# Patient Record
Sex: Female | Born: 1980 | Race: White | Hispanic: No | Marital: Married | Smoking: Former smoker
Health system: Southern US, Community
[De-identification: ages and names within clinical notes are randomized; demographics above are authoritative.]

## PROBLEM LIST (undated history)

## (undated) DIAGNOSIS — R519 Headache, unspecified: Secondary | ICD-10-CM

## (undated) DIAGNOSIS — K449 Diaphragmatic hernia without obstruction or gangrene: Secondary | ICD-10-CM

## (undated) DIAGNOSIS — I313 Pericardial effusion (noninflammatory): Secondary | ICD-10-CM

## (undated) DIAGNOSIS — M797 Fibromyalgia: Secondary | ICD-10-CM

## (undated) DIAGNOSIS — M351 Other overlap syndromes: Secondary | ICD-10-CM

## (undated) DIAGNOSIS — M1990 Unspecified osteoarthritis, unspecified site: Secondary | ICD-10-CM

## (undated) DIAGNOSIS — F32A Depression, unspecified: Secondary | ICD-10-CM

## (undated) DIAGNOSIS — R0602 Shortness of breath: Secondary | ICD-10-CM

## (undated) DIAGNOSIS — I472 Ventricular tachycardia, unspecified: Secondary | ICD-10-CM

## (undated) DIAGNOSIS — R5383 Other fatigue: Secondary | ICD-10-CM

## (undated) DIAGNOSIS — Q0700 Arnold-Chiari syndrome without spina bifida or hydrocephalus: Secondary | ICD-10-CM

## (undated) DIAGNOSIS — R002 Palpitations: Secondary | ICD-10-CM

## (undated) DIAGNOSIS — K219 Gastro-esophageal reflux disease without esophagitis: Secondary | ICD-10-CM

## (undated) DIAGNOSIS — J069 Acute upper respiratory infection, unspecified: Secondary | ICD-10-CM

## (undated) DIAGNOSIS — M329 Systemic lupus erythematosus, unspecified: Secondary | ICD-10-CM

## (undated) DIAGNOSIS — F329 Major depressive disorder, single episode, unspecified: Secondary | ICD-10-CM

## (undated) DIAGNOSIS — R51 Headache: Secondary | ICD-10-CM

## (undated) DIAGNOSIS — M0800 Unspecified juvenile rheumatoid arthritis of unspecified site: Secondary | ICD-10-CM

## (undated) DIAGNOSIS — F419 Anxiety disorder, unspecified: Secondary | ICD-10-CM

## (undated) DIAGNOSIS — I776 Arteritis, unspecified: Secondary | ICD-10-CM

## (undated) DIAGNOSIS — I3139 Other pericardial effusion (noninflammatory): Secondary | ICD-10-CM

## (undated) DIAGNOSIS — J45909 Unspecified asthma, uncomplicated: Secondary | ICD-10-CM

## (undated) DIAGNOSIS — M459 Ankylosing spondylitis of unspecified sites in spine: Secondary | ICD-10-CM

## (undated) HISTORY — DX: Major depressive disorder, single episode, unspecified: F32.9

## (undated) HISTORY — DX: Depression, unspecified: F32.A

## (undated) HISTORY — DX: Headache, unspecified: R51.9

## (undated) HISTORY — DX: Ventricular tachycardia: I47.2

## (undated) HISTORY — DX: Gastro-esophageal reflux disease without esophagitis: K21.9

## (undated) HISTORY — PX: NO PAST SURGERIES: SHX2092

## (undated) HISTORY — DX: Pericardial effusion (noninflammatory): I31.3

## (undated) HISTORY — DX: Unspecified asthma, uncomplicated: J45.909

## (undated) HISTORY — DX: Unspecified osteoarthritis, unspecified site: M19.90

## (undated) HISTORY — DX: Arnold-Chiari syndrome without spina bifida or hydrocephalus: Q07.00

## (undated) HISTORY — DX: Ventricular tachycardia, unspecified: I47.20

## (undated) HISTORY — DX: Other fatigue: R53.83

## (undated) HISTORY — DX: Shortness of breath: R06.02

## (undated) HISTORY — DX: Arteritis, unspecified: I77.6

## (undated) HISTORY — DX: Fibromyalgia: M79.7

## (undated) HISTORY — DX: Other overlap syndromes: M35.1

## (undated) HISTORY — DX: Unspecified juvenile rheumatoid arthritis of unspecified site: M08.00

## (undated) HISTORY — DX: Diaphragmatic hernia without obstruction or gangrene: K44.9

## (undated) HISTORY — DX: Anxiety disorder, unspecified: F41.9

## (undated) HISTORY — DX: Systemic lupus erythematosus, unspecified: M32.9

## (undated) HISTORY — DX: Palpitations: R00.2

## (undated) HISTORY — DX: Headache: R51

## (undated) HISTORY — DX: Acute upper respiratory infection, unspecified: J06.9

## (undated) HISTORY — DX: Other pericardial effusion (noninflammatory): I31.39

---

## 2002-12-25 ENCOUNTER — Encounter: Payer: Self-pay | Admitting: Cardiology

## 2008-11-29 ENCOUNTER — Emergency Department (HOSPITAL_COMMUNITY): Admission: EM | Admit: 2008-11-29 | Discharge: 2008-11-29 | Payer: Self-pay | Admitting: Emergency Medicine

## 2008-11-30 ENCOUNTER — Ambulatory Visit: Payer: Self-pay | Admitting: Cardiology

## 2008-12-06 ENCOUNTER — Ambulatory Visit: Payer: Self-pay | Admitting: Cardiology

## 2009-06-06 DIAGNOSIS — R002 Palpitations: Secondary | ICD-10-CM | POA: Insufficient documentation

## 2009-06-06 DIAGNOSIS — R0602 Shortness of breath: Secondary | ICD-10-CM

## 2009-06-22 IMAGING — CR DG CHEST 2V
2 series · 2 of 2 positions shown · non-contrast
Comparison: None

CLINICAL DATA: Chest pain, palpitations

CHEST - 2 VIEW

[w chest pa]
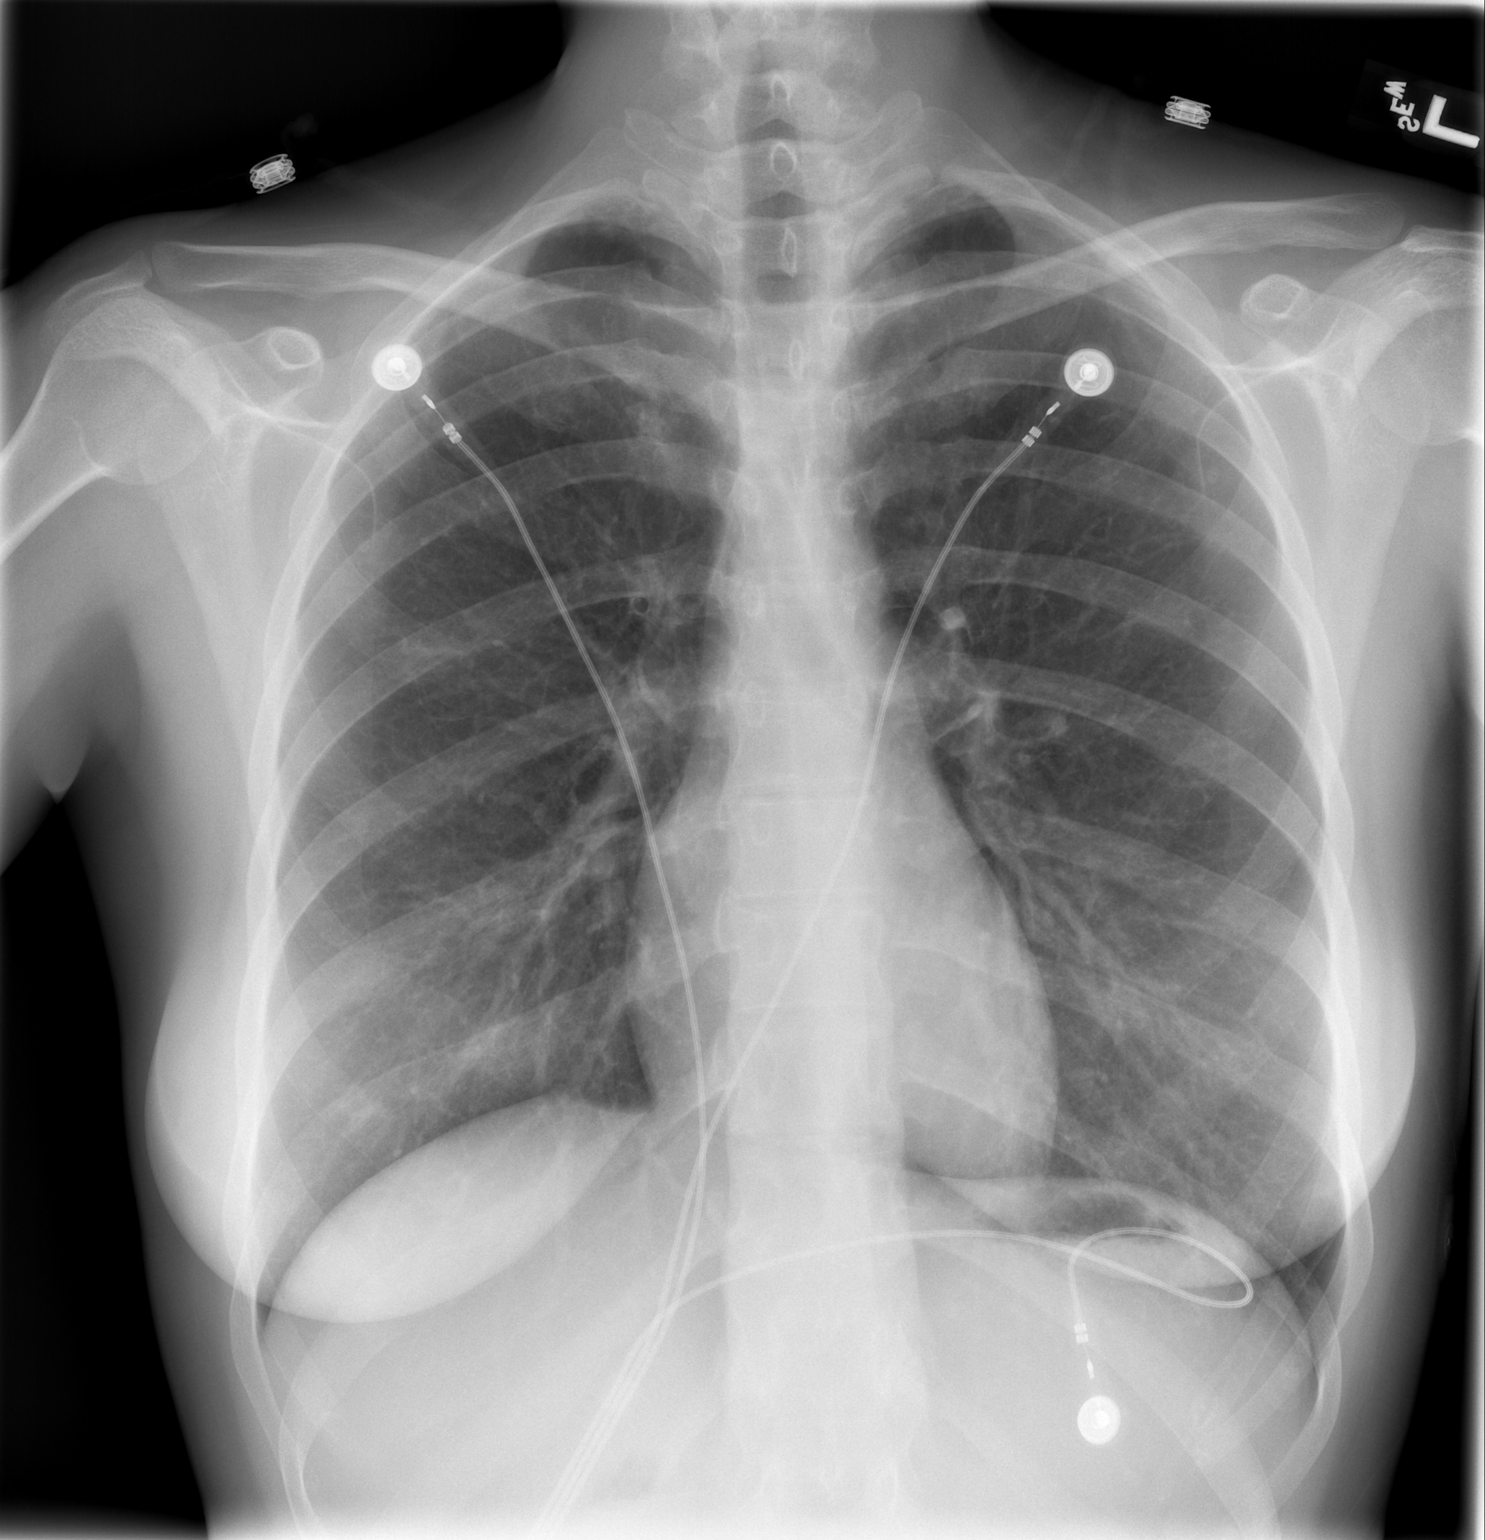

[w chest lat]
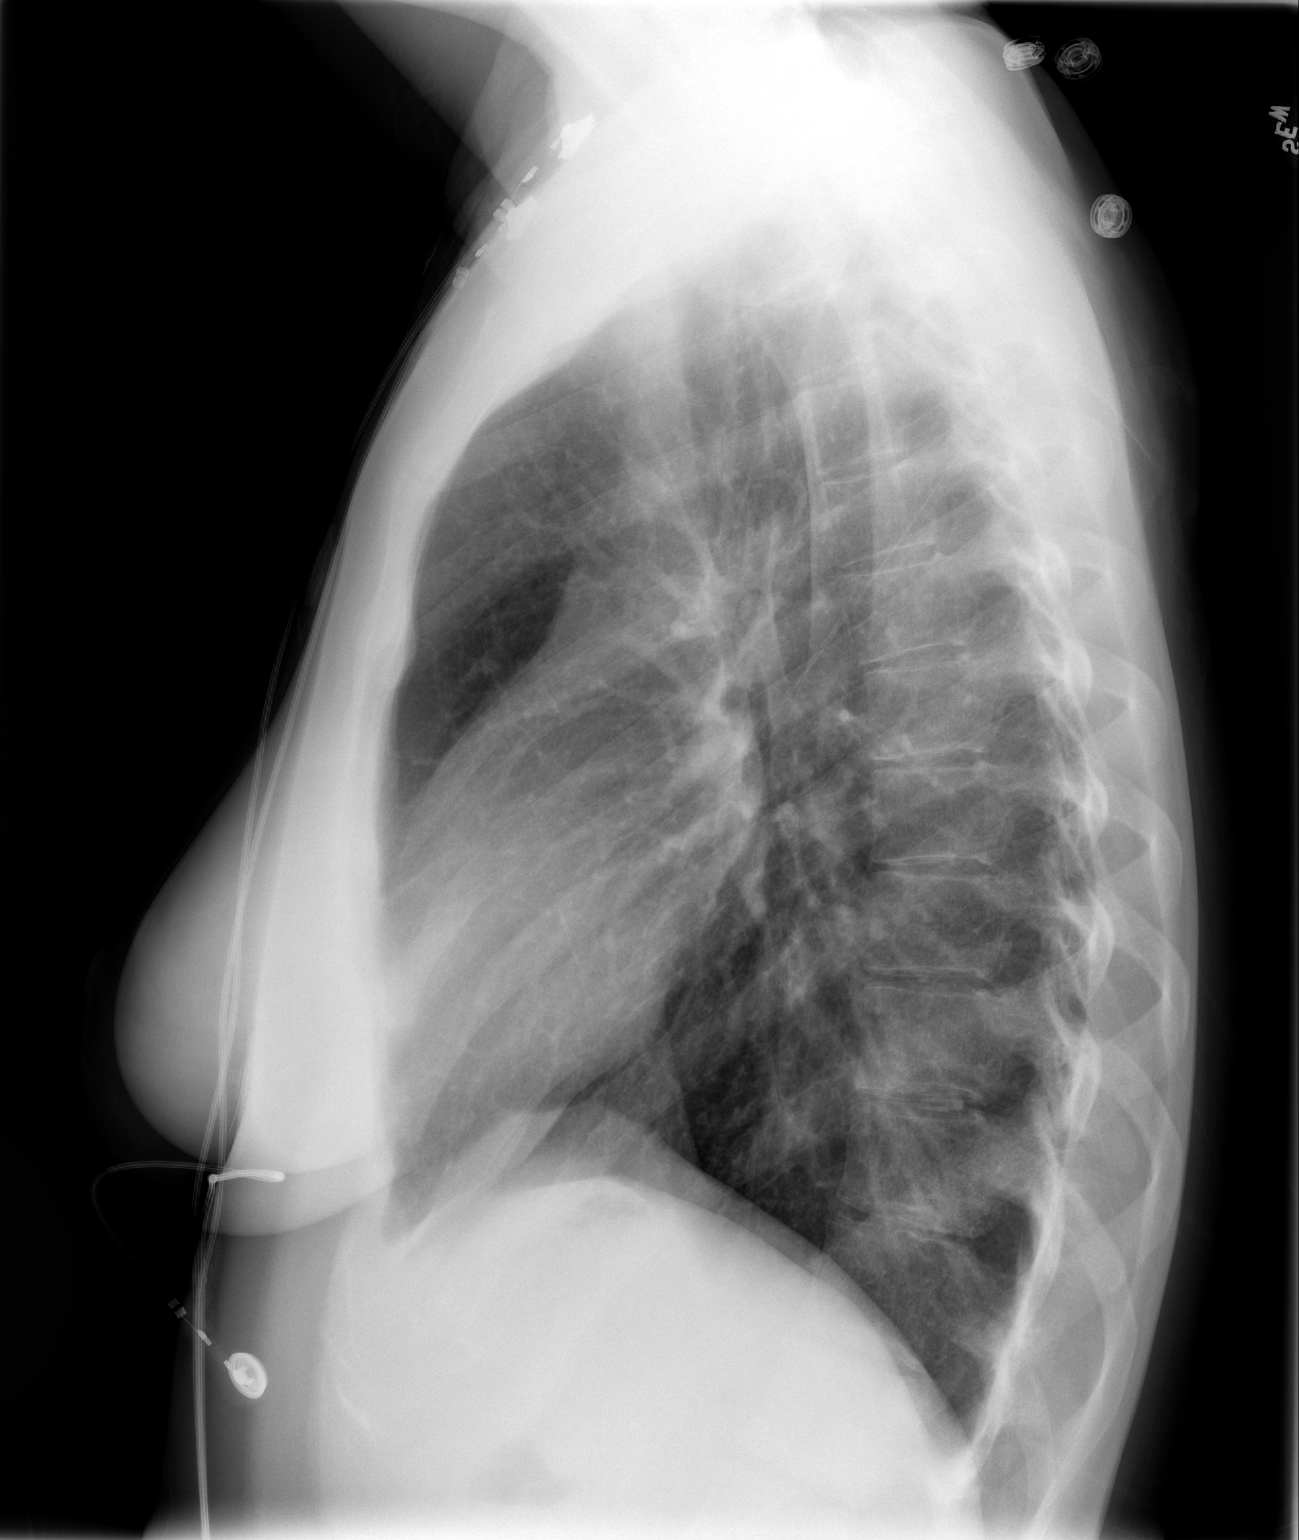

[2 of 2 positions shown; findings below may reference images not displayed]

FINDINGS: Normal heart size, mediastinal contours, and pulmonary vascularity.
Bronchitic changes and hyperaeration, question asthma.
No pulmonary infiltrate, pleural effusion, or pneumothorax.
Cardiac monitoring lines project over chest.
Bones unremarkable.
IMPRESSION: Peribronchial thickening with hyperinflated lungs, question asthma.
No acute infiltrate.

## 2009-07-23 ENCOUNTER — Telehealth (INDEPENDENT_AMBULATORY_CARE_PROVIDER_SITE_OTHER): Payer: Self-pay | Admitting: *Deleted

## 2010-09-10 ENCOUNTER — Encounter: Payer: Self-pay | Admitting: Cardiology

## 2010-10-09 NOTE — Letter (Signed)
Summary: Appointment- Rescheduled  [facility] at [facility] [address]   [place], [place] [address]   Phone: [phone]  Fax: [phone]     September 10, 2010 MRN: [id number]     [facility] [address] [place], [place]  [address]     Dear Ms. SCOTT,   Due to a change in our office schedule, your appointment on October 13, 2010 at  9:30 AM must be changed.    Your new appointment will be October 27, 2010 at 8:30 AM.   We look forward to participating in your health care needs.      Sincerely,  Glass blower/designer

## 2010-10-13 ENCOUNTER — Encounter: Payer: Self-pay | Admitting: Cardiology

## 2010-10-13 ENCOUNTER — Ambulatory Visit (INDEPENDENT_AMBULATORY_CARE_PROVIDER_SITE_OTHER): Payer: BC Managed Care – PPO | Admitting: Cardiology

## 2010-10-13 ENCOUNTER — Encounter (INDEPENDENT_AMBULATORY_CARE_PROVIDER_SITE_OTHER): Payer: Self-pay | Admitting: *Deleted

## 2010-10-13 DIAGNOSIS — IMO0001 Reserved for inherently not codable concepts without codable children: Secondary | ICD-10-CM | POA: Insufficient documentation

## 2010-10-13 DIAGNOSIS — R002 Palpitations: Secondary | ICD-10-CM

## 2010-10-13 DIAGNOSIS — M083 Juvenile rheumatoid polyarthritis (seronegative): Secondary | ICD-10-CM | POA: Insufficient documentation

## 2010-10-23 NOTE — Miscellaneous (Signed)
Summary: Orders Update - TSH  Clinical Lists Changes  Orders: Added new Test order of T-TSH ([id number]) - Signed 

## 2010-10-23 NOTE — Letter (Signed)
Summary: Graded Exercise Tolerance Test  [facility] at [facility] [address]   [place], [place] [address]   Phone: [phone]  Fax: [phone]      [facility] Cardiovascular Services  Graded Exercise Tolerance Test    [facility]  Appointment Date:_  Appointment Time:_   Your doctor has ordered a stress test to help determine the condition of your  heart during exercise. If you take blood pressure medicine , ask your doctor if you should take it the day of your test. You may eat a light meal before your test.  Please be sure to bring the copy of your order with you.   You should dress comfortably, for example: Sweat pants, shorts, or skirt, Loose                                                                                                       short sleeved T-shirt, Rubber soled lace-up shoes (tennis shoes)  You will need to arrive 15 minutes before your appointment time. You will also need to enter at the Main Entrance of the hospital and go to the registration desk. They will direct you to the Cardiovascular Department on the third floor.  You will need to plan on being at the hospital for one hour from registration for this appointment.

## 2010-10-23 NOTE — Assessment & Plan Note (Signed)
Summary: 1 YR FU MISSED LAST 2 APPTS-VS R/S DUE TO EPIC GO LIVE LETTER...   Visit Type:  Follow-up Primary Provider:  Sherryll Burger   History of Present Illness: the patient is a 30 year old female with a history of fibromyalgia and juvenile rheumatoid arthritis.  The patient has been seen in 2007 for a history of palpitations which was associated with Savella, medication which was taken at the time for fibromyalgia.  She has discontinued this in the meanwhile.  She also had a cardiac monitor done at that time which showed a single run of nonsustained ventricular tachycardia of 12 beats at a rate of hundred beats/min.  The rhythm strip was dated November 21, 2008.  No other rhythm abnormalities were observed.  The patient had a workup with an echocardiogram which was within normal limits.  After she discontinued the medication she did well and had very little in the way of palpitations.  We initially place her on Inderal but this appeared to cause weight gain and the patient was eventually switched to metoprolol.  The patient stated she had been doing well up until a few months ago.  She noted that she started having skipped beats and palpitations.  At times the last 30 seconds approximately 1 minute.  They do not occur every day.  There is no associated dizziness or weakness presyncope or syncope.  The patient does notice an increase in the symptoms with excess caffeine intake.  She denies exertional chest pains of motion muscle breath orthopnea or PND.  Her electrocardiogram was reviewed and showed normal sinus rhythm with no acute ischemic changes.  There is no evidence of prolonged QT or short QT syndrome suggestive of sodium channel abnormalities.  There is no evidence of Brugada syndrome.  The patient also denies any drugs or tobacco use.  She does report that the episodes seems to be more frequent during exertion rather then rest.  Preventive Screening-Counseling & Management  Alcohol-Tobacco     Smoking  Status: quit     Year Quit: 2008  Current Medications (verified): 1)  Metoprolol Tartrate 25 Mg Tabs (Metoprolol Tartrate) .... Take 1 1/2 Tabs (37.5mg ) Two Times A Day 2)  Multivitamins  Tabs (Multiple Vitamin) .... Take 1 Tablet By Mouth Once A Day 3)  Ibuprofen 800 Mg Tabs (Ibuprofen) .... Take 1 Tablet By Mouth Two Times A Day 4)  Amitriptyline Hcl 100 Mg Tabs (Amitriptyline Hcl) .... Take 1 Tablet By Mouth Once A Day [facility] 5)  Fish Oil 1000 Mg Caps (Omega-3 Fatty Acids) .... Take 1 Tablet By Mouth Once A Day 6)  Zarah 3-0.03 Mg Tabs (Drospirenone-Ethinyl Estradiol) .... Take 1 Tablet By Mouth Once A Day 7)  Hydrocodone-Acetaminophen 5-325 Mg Tabs (Hydrocodone-Acetaminophen) .... Take 1/2-1 Tablet By Mouth Once A Day As Needed  Allergies (verified): 1)  ! Cephalosporins  Comments:  Nurse/Medical Assistant: The patient's medication bottles and allergies were reviewed with the patient and were updated in the Medication and Allergy Lists.  Past History:  Family History: Last updated: 06/06/2009 Family History of CVA or Stroke:  Family History of Hyperlipidemia:  Family History of Hypertension:   Past Medical History: DYSPNEA (ICD-786.05) PALPITATIONS (ICD-785.1) Fibromyalgia.  Small pericardial effusion.  history of nonsustained ventricular tachycardia by cardiac monitor November 21, 2008 no other arrhythmias and negative workup for structural heart disease [normal echocardiogram]  Review of Systems       The patient complains of fatigue and palpitations.  The patient denies malaise, fever, weight gain/loss,  vision loss, decreased hearing, hoarseness, chest pain, shortness of breath, prolonged cough, wheezing, sleep apnea, coughing up blood, abdominal pain, blood in stool, nausea, vomiting, diarrhea, heartburn, incontinence, blood in urine, muscle weakness, joint pain, leg swelling, rash, skin lesions, headache, fainting, dizziness, depression, anxiety, enlarged lymph nodes, easy  bruising or bleeding, and environmental allergies.    Vital Signs:  Patient profile:   30 year old female Height:      70 inches Weight:      172 pounds BMI:     24.77 Pulse rate:   69 / minute BP sitting:   116 / 67  (left arm) Cuff size:   regular  Vitals Entered By: Carlye Grippe (October 13, 2010 9:41 AM)  Physical Exam  Additional Exam:  General: Well-developed, well-nourished in no distress head: Normocephalic and atraumatic eyes PERRLA/EOMI intact, conjunctiva and lids normal nose: No deformity or lesions mouth normal dentition, normal posterior pharynx neck: Supple, no JVD.  No masses, thyromegaly or abnormal cervical nodes lungs: Normal breath sounds bilaterally without wheezing.  Normal percussion heart: regular rate and rhythm with normal S1 and S2, no S3 or S4.  PMI is normal.  No pathological murmurs abdomen: Normal bowel sounds, abdomen is soft and nontender without masses, organomegaly or hernias noted.  No hepatosplenomegaly musculoskeletal: Back normal, normal gait muscle strength and tone normal pulsus: Pulse is normal in all 4 extremities Extremities: No peripheral pitting edema neurologic: Alert and oriented x 3 skin: Intact without lesions or rashes, multiple tattoos are noticed on the back cervical nodes: No significant adenopathy psychologic: Normal affect    EKG  Procedure date:  10/13/2010  Findings:      normal sinus rhythm no acute ischemic changes  Impression & Recommendations:  Problem # 1:  PALPITATIONS (ICD-785.1) the patient reports an increase in palpitations during activity.  On baseline EKG there appears to be no evidence of a channelopathy. However we will make sure that the patient does not have exercise-induced ventricular tachycardia.  She will be scheduled for an exercise treadmill test.  I asked her to hold her metoprolol the morning of the stress test.  In the interim however I did increase her metoprolol to 37.5 milligrams p.o.  b.i.d.because the patient does report increasing palpitations if he does skip a dose of her medication.  Will also schedule a TSH. Her updated medication list for this problem includes:    Metoprolol Tartrate 25 Mg Tabs (Metoprolol tartrate) .Marland Kitchen... Take 1 1/2 tabs (37.5mg ) two times a day  Orders: EKG w/ Interpretation (93000) GXT (GXT)  Problem # 2:  DYSPNEA (ICD-786.05) no evidence of recurrent dyspnea.  No prior history of structural heart disease. Her updated medication list for this problem includes:    Metoprolol Tartrate 25 Mg Tabs (Metoprolol tartrate) .Marland Kitchen... Take 1 1/2 tabs (37.5mg ) two times a day  Problem # 3:  FIBROMYALGIA (ICD-729.1) Assessment: Comment Only currently not on medications without any significant symptoms.  Patient Instructions: 1)  GXT - exercise stress test - hold Metoprolol a.m. of test 2)  Increase Metoprolol to 37.5mg  two times a day  3)  Follow up in  6 months Prescriptions: METOPROLOL TARTRATE 25 MG TABS (METOPROLOL TARTRATE) take 1 1/2 tabs (37.5mg ) two times a day  #90 x 6   Entered by:   Hoover Brunette, LPN   Authorized by:   Lewayne Bunting, MD, [facility]   Signed by:   Hoover Brunette, LPN on 04/54/0981   Method used:   Electronically to  [facility] (retail)       [address]       [place], [place]  [address]       Ph: [phone]       Fax: [phone]   RxID:   [id number]

## 2010-10-27 ENCOUNTER — Ambulatory Visit: Payer: Self-pay | Admitting: Cardiology

## 2010-11-05 ENCOUNTER — Encounter: Payer: Self-pay | Admitting: Cardiology

## 2010-11-05 DIAGNOSIS — R002 Palpitations: Secondary | ICD-10-CM

## 2010-11-11 ENCOUNTER — Encounter (INDEPENDENT_AMBULATORY_CARE_PROVIDER_SITE_OTHER): Payer: Self-pay | Admitting: *Deleted

## 2010-11-18 NOTE — Letter (Signed)
Summary: Engineer, materials at [facility]  [address]   [place], [place] [address]   Phone: [phone]  Fax: [phone]        November 11, 2010 MRN: [id number]   [facility] [address] [place], [place]  [address]   Dear Ms. Anita George,  Your test ordered by Selena Batten has been reviewed by your physician (or physician assistant) and was found to be normal or stable. Your physician (or physician assistant) felt no changes were needed at this time.  ____ Echocardiogram  __X__ Cardiac Stress Test  __X__ Lab Work - thyroid studies normal  ____ Peripheral vascular study of arms, legs or neck  ____ CT scan or X-ray  ____ Lung or Breathing test  ____ Other:   Thank you.   Hoover Brunette, LPN    Duane Boston, M.D., F.A.C.C. Thressa Sheller, M.D., F.A.C.C. Oneal Grout, M.D., F.A.C.C. Cheree Ditto, M.D., F.A.C.C. Daiva Nakayama, M.D., F.A.C.C. Kenney Houseman, M.D., F.A.C.C. Jeanne Ivan, PA-C

## 2010-12-18 LAB — POCT CARDIAC MARKERS
CKMB, poc: <1 ng/mL — ABNORMAL LOW (ref 1.0–8.0)
Troponin i, poc: <0.05 ng/mL (ref 0.00–0.09)

## 2010-12-18 LAB — POCT I-STAT, CHEM 8
BUN: 8 mg/dL (ref 6–23)
Chloride: 108 mEq/L (ref 96–112)
Creatinine, Ser: 0.8 mg/dL (ref 0.4–1.2)
Glucose, Bld: 79 mg/dL (ref 70–99)
Potassium: 3.5 mEq/L (ref 3.5–5.1)

## 2010-12-18 LAB — DIFFERENTIAL
Eosinophils Absolute: 0.1 10*3/uL (ref 0.0–0.7)
Lymphs Abs: 2.7 10*3/uL (ref 0.7–4.0)
Monocytes Relative: 7 % (ref 3–12)
Neutro Abs: 5.4 10*3/uL (ref 1.7–7.7)
Neutrophils Relative %: 60 % (ref 43–77)

## 2010-12-18 LAB — CBC
MCV: 86.6 fL (ref 78.0–100.0)
RBC: 4.35 MIL/uL (ref 3.87–5.11)
WBC: 9 10*3/uL (ref 4.0–10.5)

## 2011-01-20 NOTE — Assessment & Plan Note (Signed)
[facility]                          [facility] OFFICE NOTE   Anita George, Anita George                        MRN:          [id number]  DATE:11/30/2008                            DOB:          September 27, 1980    REFERRING PHYSICIAN:  Kirstie Peri, MD   The patient is actually self-referred by her grandmother.   HISTORY OF PRESENT ILLNESS:  The patient is a 30 year old female with a  history of fibromyalgia and juvenile rheumatoid arthritis.  The patient  over the last couple of weeks has complained of significant  palpitations.  This has been associated to the left-sided chest pain.  She also reports during one of her episodes of palpitations, syncope.  She states this was very brief and she does not recall the exact  circumstances.  She works as a Pensions consultant at [employer].  She  states that the palpitations occur on a daily basis and are associated  with some shortness of breath.  She states that particularly when she  starts taking Savella for fibromyalgias that these symptoms have started  to occur.  Dr. Sherryll Burger has placed a CardioNet monitor and there was 1  episode of ventricular tachycardia noted at 12 beats, but this was slow  VT at 100 beats per minute.  There is no clear history that there was  any syncope associated with this.  The rhythm strip was dated on November 21, 2008.  The patient denies any exertional chest pain, orthopnea, PND,  or other cardiovascular symptoms, apart from the symptoms as outlined  above.  She had an echocardiographic study done, which showed  essentially normal heart size and function, but there was a small  pericardial effusion.  EKG demonstrates normal sinus rhythm with no  acute ischemic changes and no evidence of acute pericarditis.  The  patient does state that when she is not taking Savella, her  fibromyalgias are rather severe particularly in her neck area and back,  as well as in the joints and prior to this,  she used to take p.r.n.  ibuprofen.   ALLERGIES:  Cephalosporins cause a rash.   MEDICATIONS:  1. Savella 50 mg p.o. b.i.d.  2. Claritin 10 mg p.o. daily.  3. Ocella BC daily.  4. Clonazepam 0.5 mg p.r.n.   SOCIAL HISTORY:  The patient works at [employer].  She is  married.  She is to be in accounting.  The patient occasionally drinks  beer.  She occasionally use marijuana, but quit in 1999.  She quit also  smoking in 2008.   FAMILY HISTORY:  Both Father and mother are alive as well as brother and  sister, and there is a family history of stroke, high cholesterol, and  high blood pressure.   REVIEW OF SYSTEMS:  The patient does complain of sharp knife-like pain  and tightness.  She rate this as a 7/10.  She states it is a random  pattern and it is usually at rest.  The duration is 10-13 minutes and is  associated with nausea and dizziness and shortness of breath.  REMAINDER REVIEW OF SYSTEMS:  The patient occasionally reports headaches  and fatigue.  NEUROLOGIC:  She complains of weakness.  HEMATOLOGIC AND  ENDOCRINE:  There is no problem.  CARDIOVASCULAR:  As outlined above.  GASTROINTESTINAL:  She reports no symptoms.  GU:  She reports no  symptoms.  MUSCULOSKELETAL:  She reports muscle disease.   PHYSICAL EXAMINATION:  VITAL SIGNS:  Blood pressure is 127/76, heart  rate 98, and weight 165 pounds.  GENERAL:  A well-nourished white female in no apparent distress.  HEENT:  Pupils isocoric.  Conjunctiva clear.  NECK:  Supple.  Normal carotid stroke and no carotid bruits.  No  thyromegaly.  No nodular thyroid.  LUNGS:  Clear breath sounds bilaterally.  HEART:  Regular rate and rhythm with normal S1, S2, and no S3.  There is  no pathological murmurs.  ABDOMEN:  Soft, nontender.  No rebound or guarding.  Good bowel sounds.  EXTREMITIES:  No cyanosis, clubbing, or edema.  NEURO:  The patient is alert, oriented, and grossly nonfocal.   PROBLEM LIST:  1. Palpitations and  chest pain.  2. Dyspnea.  3. Fibromyalgia.  4. Small pericardial effusion.   PLAN:  1. The etiology of the patient's palpitations are not entirely clear,      but there is a CardioNet monitor that demonstrates an episode of 12      beats of nonsustained ventricular tachycardia.  The patient,      however, by echo has no significant structural heart disease.  The      latter could be causing her symptoms and could be related to the      [facility] that she is using.  2. I would recommend for the patient to discontinue Savella and we      will start on propranolol LA 60 mg p.o. daily, partly also because      this can help her with a component of anxiety.  3. I told the patient that I am not sure why she has a small      pericardial effusion, but we will further examine her with      laboratory studies in particular to rule out the collagen vascular      diseases and I have ordered a sed rate, a high-sensitivity CRP and      ANA and rheumatoid factor, particularly in light of her history of      juvenile rheumatoid arthritis.  Her diagnosis of fibromyalgia seems      to be well established, although if there is any abnormalities in      her sed rate and high-sensitivity CRP.  A further workup may be      required.  4. I do not think the patient needs any stress testing; but at this      point, we will stop the [facility] and assess the response to      propranolol LA.     Learta Codding, MD,FACC  Electronically Signed    GED/MedQ  DD: 12/02/2008  DT: 12/03/2008  Job #: [id number]   cc:   Kirstie Peri, MD

## 2011-03-05 ENCOUNTER — Encounter: Payer: Self-pay | Admitting: Cardiology

## 2011-05-28 ENCOUNTER — Other Ambulatory Visit: Payer: Self-pay | Admitting: Cardiology

## 2011-07-27 ENCOUNTER — Other Ambulatory Visit: Payer: Self-pay | Admitting: Cardiology

## 2011-09-25 ENCOUNTER — Other Ambulatory Visit: Payer: Self-pay | Admitting: Cardiology

## 2012-09-07 HISTORY — PX: UPPER GASTROINTESTINAL ENDOSCOPY: SHX188

## 2012-10-05 ENCOUNTER — Encounter (INDEPENDENT_AMBULATORY_CARE_PROVIDER_SITE_OTHER): Payer: Self-pay | Admitting: Internal Medicine

## 2012-10-05 ENCOUNTER — Ambulatory Visit (INDEPENDENT_AMBULATORY_CARE_PROVIDER_SITE_OTHER): Payer: BC Managed Care – PPO | Admitting: Internal Medicine

## 2012-10-05 VITALS — BP 108/50 | HR 60 | Temp 98.3°F | Ht 71.0 in | Wt 156.2 lb

## 2012-10-05 DIAGNOSIS — R634 Abnormal weight loss: Secondary | ICD-10-CM

## 2012-10-05 DIAGNOSIS — R1012 Left upper quadrant pain: Secondary | ICD-10-CM

## 2012-10-05 DIAGNOSIS — K625 Hemorrhage of anus and rectum: Secondary | ICD-10-CM

## 2012-10-05 DIAGNOSIS — M954 Acquired deformity of chest and rib: Secondary | ICD-10-CM | POA: Insufficient documentation

## 2012-10-05 LAB — CBC WITH DIFFERENTIAL/PLATELET
Basophils Relative: 1 % (ref 0–1)
Hemoglobin: 13.3 g/dL (ref 12.0–15.0)
Lymphs Abs: 2.3 10*3/uL (ref 0.7–4.0)
Monocytes Relative: 10 % (ref 3–12)
Neutro Abs: 3.6 10*3/uL (ref 1.7–7.7)
Neutrophils Relative %: 53 % (ref 43–77)
RBC: 4.67 MIL/uL (ref 3.87–5.11)

## 2012-10-05 NOTE — Progress Notes (Addendum)
Subjective:     Patient ID: Anita George, female   DOB: [birth year], 32 y.o.   MRN: [id number]  HPI  Referred to our office by Dr. Sherryll Burger for epigastric pain. She says she has a swelling to her abdomen which comes and goes. She says it feels like her ribs are spread. Everytime she has a BM she has pain left upper abdomen. Appetite is not good. She says she snack all day. She does not feel she can eat meal. She tells me everytime she eats, she will become nauseated. Symptoms started in July. Sudden onset. She has a BM about three times a week. Stools are usually green in color or light brown. She tells me she has had some bright red rectal bleeding. Last episode was one week ago.  She says the blood dripped in the toilet.  Really no acid reflux LMP 2 weeks ago: normal.  She is a Pharmacologist. Hx of fibromyalgia. No hx of injury   She has lost 35 pounds since July. She says she cannot eat. She has epigastric pain when she eats.  04/04/2012  US abdomen :nausea: Negative exam. Gallbladder unremarkable with no evidence of any gallstone or wall thickening or pericholecystic fluid. CBD top normal in diameter at 5mm. 04/11/2012 Urinalaysis negative., NA 139, K 3.7, Glucose 116, Creatinine 0.63, Protein 6.4, Calcium 9.2, ALP 38, AST 13, ALT 12, H and H 13.0 and 37.4, Platelet ct 118, MPV 11.1, H. Pylori negative   09/21/2012 Chest xray: No active disease. Bone thorax is unremarkable.   Review of Systems see hpi Current Outpatient Prescriptions  Medication Sig Dispense Refill  . baclofen (LIORESAL) 10 MG tablet Take 10 mg by mouth at bedtime.      . buprenorphine (BUTRANS) 10 MCG/HR PTWK Place 10 mcg onto the skin once a week.      . drospirenone-ethinyl estradiol (ZARAH) 3-0.03 MG per tablet Take 1 tablet by mouth daily.        Marland Kitchen HYDROcodone-acetaminophen (NORCO) 5-325 MG per tablet Take 1 tablet by mouth daily as needed. Take 1/2-1 tab       . metoprolol tartrate (LOPRESSOR) 25 MG tablet  TAKE ONE & ONE-HALF TABLETS BY MOUTH TWICE DAILY  90 tablet  0  . Multiple Vitamins-Minerals (MULTIVITAL) tablet Take 1 tablet by mouth daily.        . Omega-3 Fatty Acids (FISH OIL) 1000 MG CAPS Take by mouth daily.        Marland Kitchen omeprazole (PRILOSEC) 20 MG capsule Take 20 mg by mouth 2 (two) times daily before a meal.      . polyethylene glycol (MIRALAX / GLYCOLAX) packet Take 17 g by mouth daily.       Past Medical History  Diagnosis Date  . Shortness of breath   . Palpitations   . Fibromyalgia   . Pericardial effusion   . Ventricular tachycardia     nonsutained by cardiac monitor November 21, 2008 no other arrhythmias and negative workup for structural heart diseas e(normal echo)   History reviewed. No pertinent past surgical history. Allergies  Allergen Reactions  . Cephalosporins     REACTION: rash       Objective:   Physical Exam  Filed Vitals:   10/05/12 1423  BP: 108/50  Pulse: 60  Temp: 98.3 F (36.8 C)  Height: 5\' 11"  (1.803 m)  Weight: 156 lb 3.2 oz (70.852 kg)  Alert and oriented. Skin warm and dry. Oral mucosa is moist.   .  Sclera anicteric, conjunctivae is pink. Thyroid not enlarged. No cervical lymphadenopathy. Lungs clear. Heart regular rate and rhythm.  Abdomen is soft. Noted  Bowel sounds are positive. No hepatomegaly. No abdominal masses felt. No tenderness.  No edema to lower extremities.         Assessment:    Possible IBS. Patient has had some blood with her BMs with out Miralax. Dr. Karilyn Cota in with patient.   Deformity of left ribs, possibly related to scoliosis.  Plan:    CT abdomen/pelvis with CM. If CT shows bowel thickening ? Colonoscopy.

## 2012-10-05 NOTE — Patient Instructions (Addendum)
Labs and CT. Further recommendations to follow 

## 2012-10-06 ENCOUNTER — Telehealth (INDEPENDENT_AMBULATORY_CARE_PROVIDER_SITE_OTHER): Payer: Self-pay | Admitting: Internal Medicine

## 2012-10-06 LAB — COMPREHENSIVE METABOLIC PANEL
ALT: 14 U/L (ref 0–35)
Albumin: 4.5 g/dL (ref 3.5–5.2)
Alkaline Phosphatase: 46 U/L (ref 39–117)
CO2: 21 mEq/L (ref 19–32)
Glucose, Bld: 75 mg/dL (ref 70–99)
Potassium: 4 mEq/L (ref 3.5–5.3)
Sodium: 139 mEq/L (ref 135–145)
Total Protein: 7.5 g/dL (ref 6.0–8.3)

## 2012-10-06 NOTE — Telephone Encounter (Signed)
none

## 2012-10-10 ENCOUNTER — Ambulatory Visit (INDEPENDENT_AMBULATORY_CARE_PROVIDER_SITE_OTHER): Payer: BC Managed Care – PPO | Admitting: Internal Medicine

## 2012-10-10 ENCOUNTER — Ambulatory Visit (HOSPITAL_COMMUNITY)
Admission: RE | Admit: 2012-10-10 | Discharge: 2012-10-10 | Disposition: A | Discharge status: Home / Self Care | Payer: BC Managed Care – PPO | Source: Ambulatory Visit | Attending: Internal Medicine | Admitting: Internal Medicine

## 2012-10-10 DIAGNOSIS — R110 Nausea: Secondary | ICD-10-CM | POA: Insufficient documentation

## 2012-10-10 DIAGNOSIS — R1012 Left upper quadrant pain: Secondary | ICD-10-CM | POA: Insufficient documentation

## 2012-10-10 DIAGNOSIS — R634 Abnormal weight loss: Secondary | ICD-10-CM | POA: Insufficient documentation

## 2012-10-10 IMAGING — CT CT ABD-PELV W/ CM
2 of 4 series · 16 of 46 positions shown, 18 images · IV contrast (Omnipaque 300)
Comparison: None

CLINICAL DATA: Left upper abdominal pain. Nausea and constipation.
Pain with bowel movements.  Weight loss..

CT ABDOMEN AND PELVIS WITH CONTRAST
TECHNIQUE: Multidetector CT imaging of the abdomen and pelvis was
performed following the standard protocol during bolus
administration of intravenous contrast.
Contrast: 100mL OMNIPAQUE IOHEXOL 300 MG/ML  SOLN

[Series 2: abd_pel_with 5.0 b40f · axial · 0.62mm/px · z∈[-489,-49]mm · 13 of 96 slices shown, 15 images]
[im 4/96  soft-tissue]
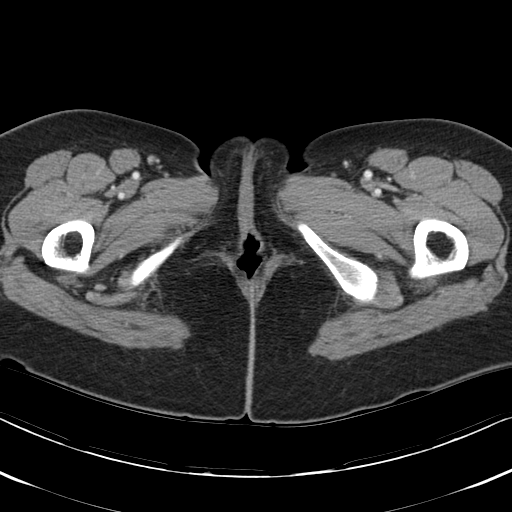
[im 4/96  bone]
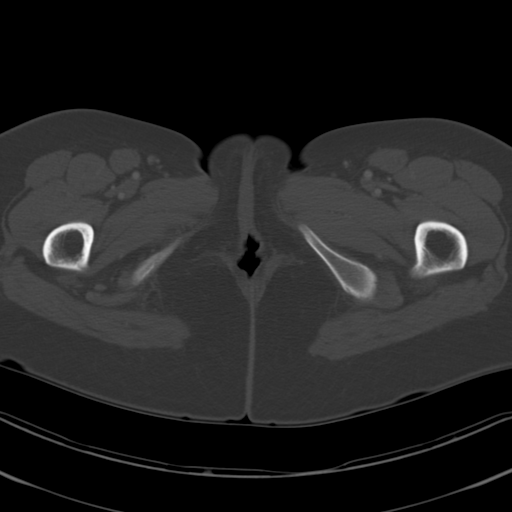
[im 12/96  soft-tissue]
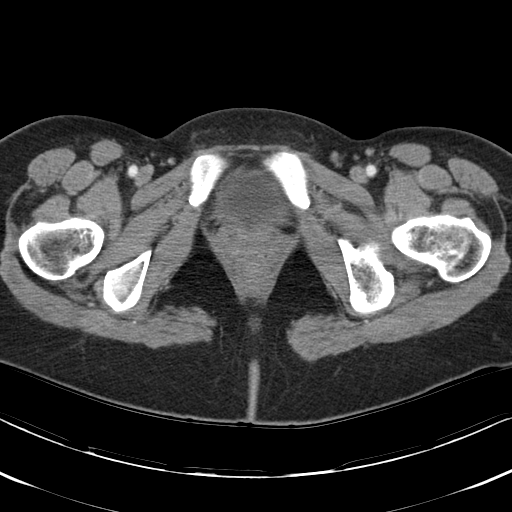
[im 20/96  soft-tissue]
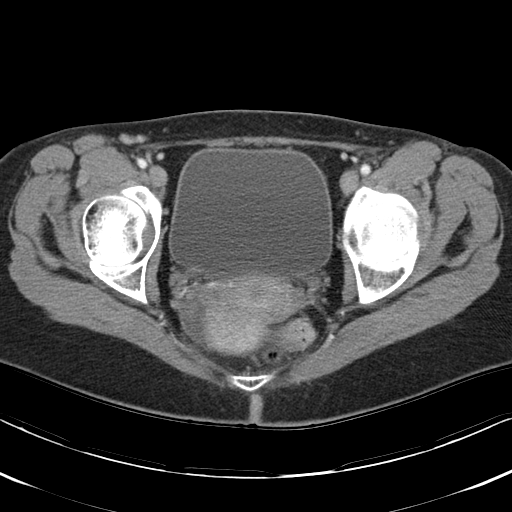
[im 27/96  soft-tissue]
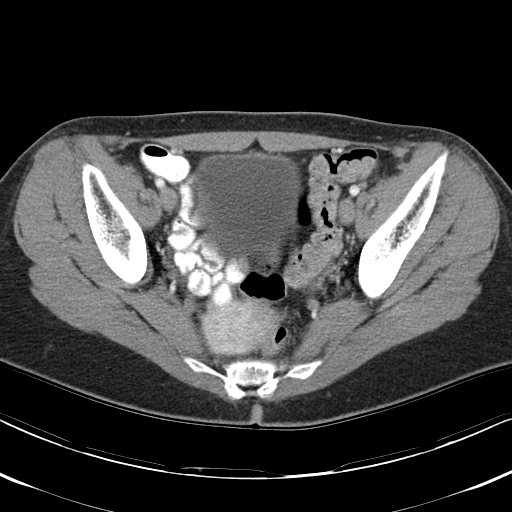
[im 35/96  soft-tissue]
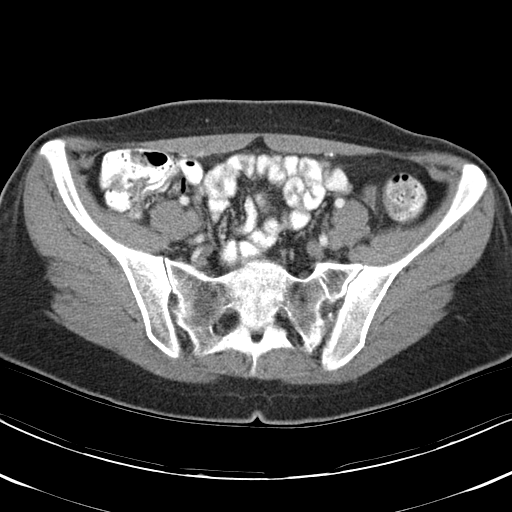
[im 42/96  soft-tissue]
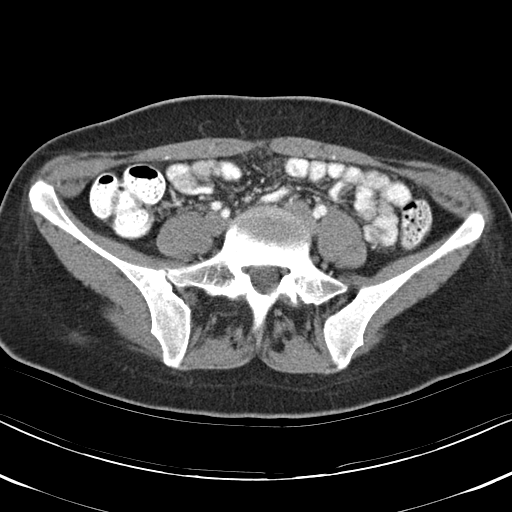
[im 50/96  soft-tissue]
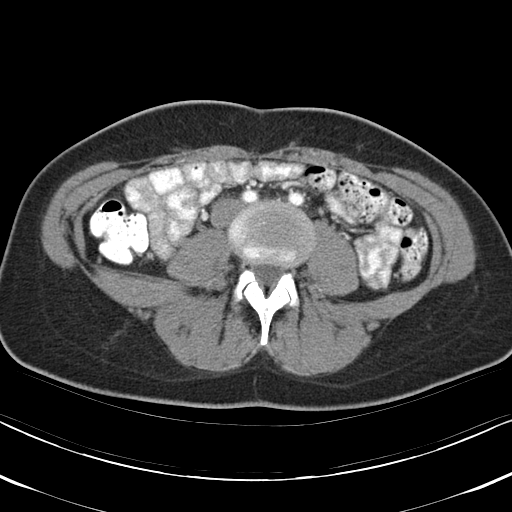
[im 54/96  soft-tissue]
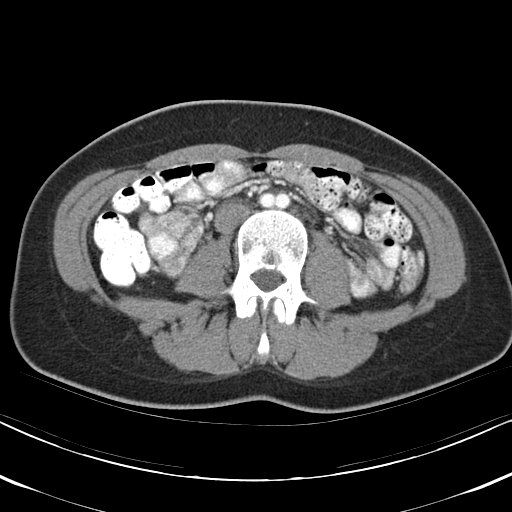
[im 61/96  soft-tissue]
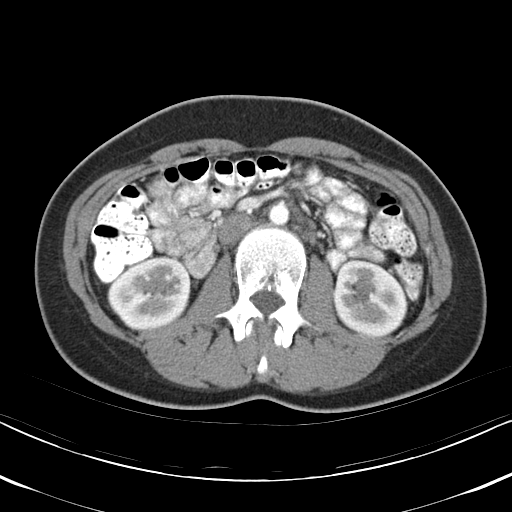
[im 61/96  bone]
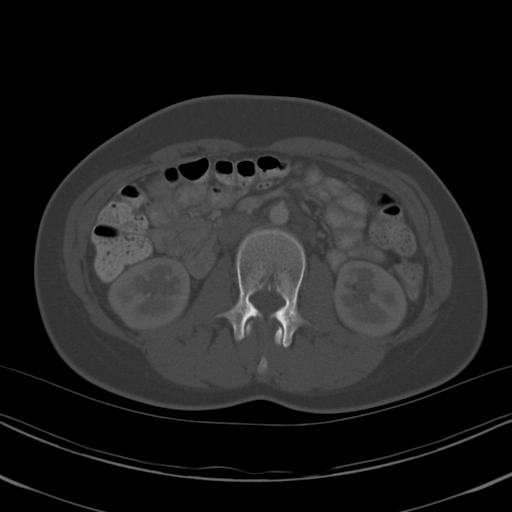
[im 69/96  soft-tissue]
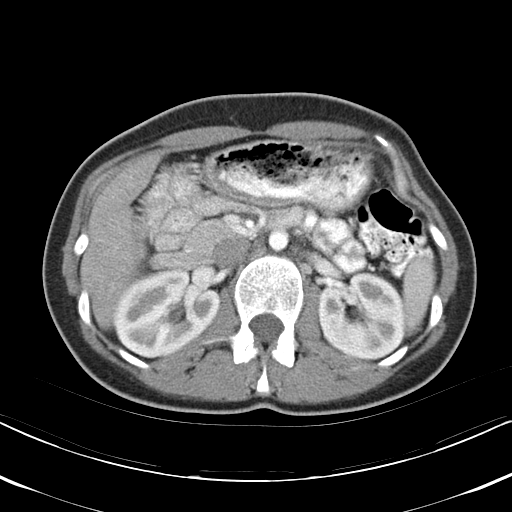
[im 77/96  soft-tissue]
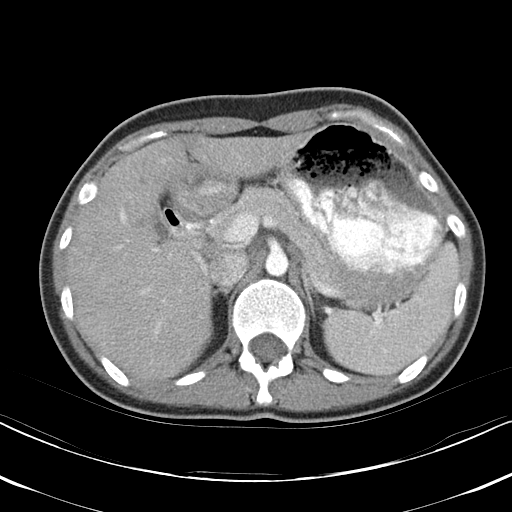
[im 84/96  soft-tissue]
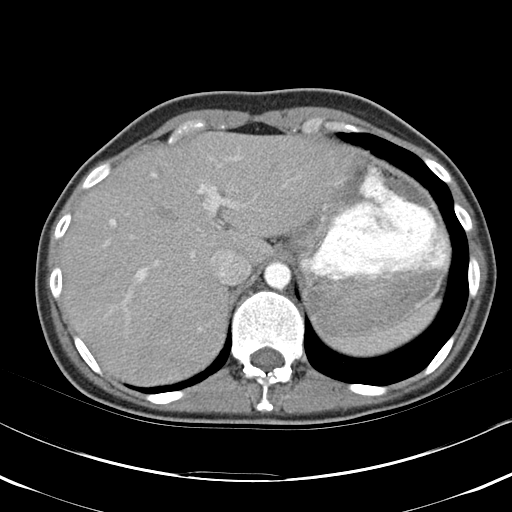
[im 92/96  soft-tissue]
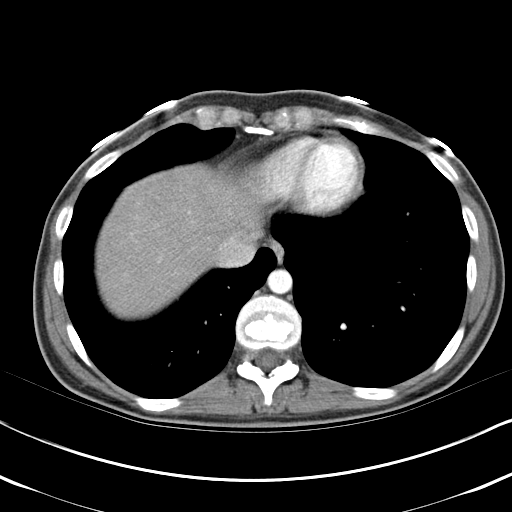

[Series 5: abd_pel_with 3.0 spo cor · coronal · 0.63mm/px · 3 of 71 slices shown]
[im 24/71  soft-tissue]
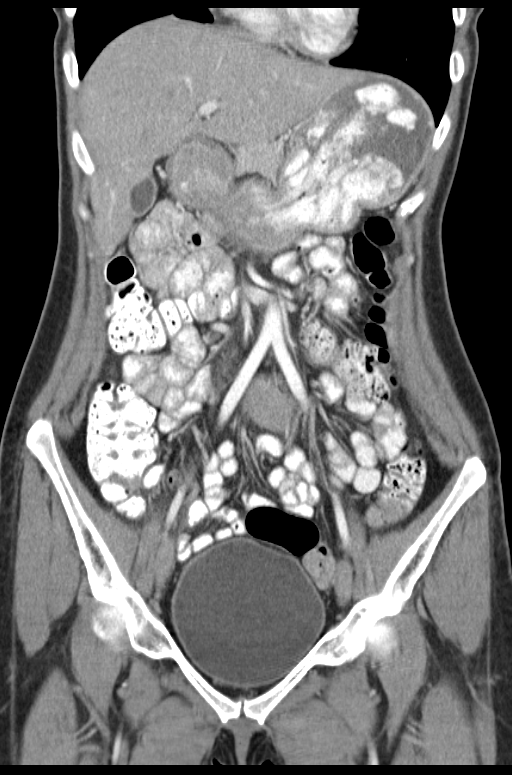
[im 32/71  soft-tissue]
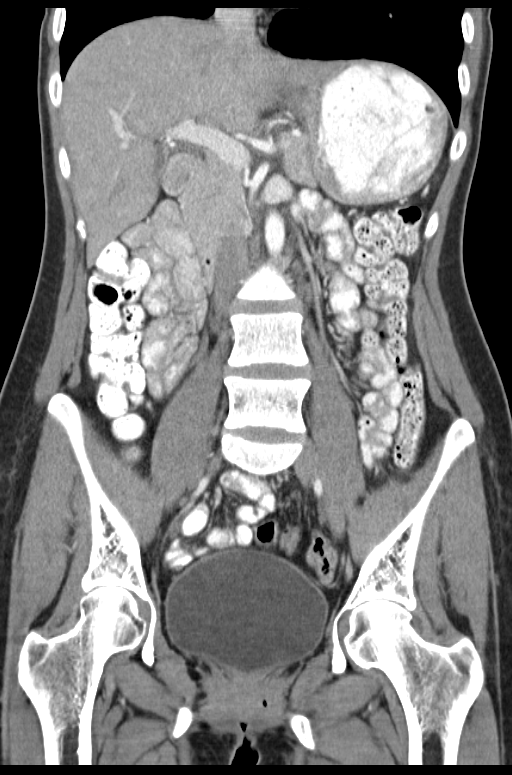
[im 39/71  soft-tissue]
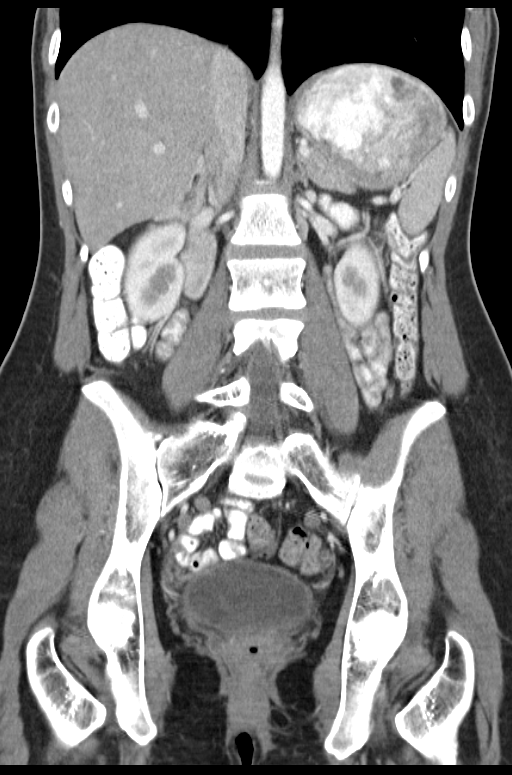

[16 of 46 positions shown; findings below may reference images not displayed]

FINDINGS: Lung bases:  The lung bases appear clear.  No pericardial
or pleural effusion.

Abdomen/pelvis:  No focal liver abnormality identified.  The
gallbladder appears within normal limits.  No biliary dilatation.
Normal appearance of the pancreas.  The spleen is negative.

Adrenal glands are both normal.  Normal appearance of both kidneys.
No obstructive uropathy.  The urinary bladder is normal.  The
uterus and the adnexal structures are unremarkable.

No adenopathy identified within the upper abdomen.  There is no
pelvic or inguinal adenopathy identified.

The stomach is normal.  The small bowel loops are unremarkable.
Normal appearance of the appendix.  The colon is negative.

Bones/Musculoskeletal:  Visualized osseous structures are
unremarkable.
IMPRESSION: 1.  No acute findings identified within the abdomen or pelvis.
2.  No mass or adenopathy identified.

## 2012-10-10 MED ORDER — IOHEXOL 300 MG/ML  SOLN
100.0000 mL | Freq: Once | INTRAMUSCULAR | Status: AC | PRN
  Administered 2012-10-10: 100 mL via INTRAVENOUS

## 2012-10-12 ENCOUNTER — Encounter (INDEPENDENT_AMBULATORY_CARE_PROVIDER_SITE_OTHER): Payer: Self-pay

## 2012-10-12 NOTE — Progress Notes (Signed)
Call and mail box is full. Will try again tomorrow.

## 2012-10-18 NOTE — Progress Notes (Signed)
LM for Terrace to return the call to get a f/u apt scheduled.

## 2012-10-24 NOTE — Progress Notes (Signed)
LM for [place] to return the call to schedule a one month f/u apt.

## 2012-10-27 ENCOUNTER — Telehealth (INDEPENDENT_AMBULATORY_CARE_PROVIDER_SITE_OTHER): Payer: Self-pay | Admitting: *Deleted

## 2012-10-27 NOTE — Telephone Encounter (Signed)
Spoke with Anita George to schedule her 1 month f/u Anita Ar, NP requested. Anita George was told her sickness was not GI related and wishes not to reschedule at this time. Will only if the provider feels like they really need to see her.

## 2012-10-27 NOTE — Progress Notes (Signed)
Spoke with Yunuen to schedule her 1 month f/u Terri Setzer, NP requested. Kishia was told her sickness was not GI related and wishes not to reschedule at this time. Will only if the provider feels like they really need to see her. 

## 2012-11-17 ENCOUNTER — Institutional Professional Consult (permissible substitution): Payer: BC Managed Care – PPO | Admitting: Pulmonary Disease

## 2012-11-22 ENCOUNTER — Encounter: Payer: Self-pay | Admitting: Pulmonary Disease

## 2012-11-22 ENCOUNTER — Ambulatory Visit (INDEPENDENT_AMBULATORY_CARE_PROVIDER_SITE_OTHER): Payer: BC Managed Care – PPO | Admitting: Pulmonary Disease

## 2012-11-22 ENCOUNTER — Institutional Professional Consult (permissible substitution): Payer: BC Managed Care – PPO | Admitting: Pulmonary Disease

## 2012-11-22 VITALS — BP 100/62 | HR 64 | Temp 99.1°F | Ht 71.0 in | Wt 150.0 lb

## 2012-11-22 DIAGNOSIS — R0602 Shortness of breath: Secondary | ICD-10-CM

## 2012-11-22 NOTE — Patient Instructions (Addendum)
Will schedule for breathing studies to make sure that you do not have airways disease such as asthma or copd.  Will see you back the same day for review.

## 2012-11-22 NOTE — Assessment & Plan Note (Signed)
The pt describes episodic dyspnea at rest that occurs for no reason, and often associated when she feels nausea or if she carries on prolonged conversation.  She does not really feel she has sob with exertion.  I am unable to synthesize her various complaints into one unified pulmonary diagnosis, and agree with her primary that she should be evaluated for autoimmune disease.  Her chest ct and lung exam are clear, but does have a h/o smoking for a period of time.  Would like to do pulmonary function studies for completeness to r/o airways disease.  If normal, I really do not see any obvious primary pulmonary issue to explain her symptoms, and would consider autoimmune workup and possibly an echo to evaluate for pulmonary htn

## 2012-11-22 NOTE — Progress Notes (Signed)
  Subjective:    Patient ID: Anita George, female    DOB: [birth year], 32 y.o.   MRN: [id number]  HPI The patient is a 32 year old female who had been asked to see for the complaint of dyspnea.  The patient denies any prior pulmonary history, and was in her usual state of health until July of last year.  She began to have issues with episodic dyspnea, and also other symptoms such as nausea, epigastric discomfort, anorexia, and discomfort in her rib cage.  She has now lost a significant amount of weight, and finds it very difficult to eat.  She is apparently seeing gastroenterology, and has had upper endoscopy.  She describes her shortness of breath as occurring when she feels sick and about to vomit, or if she has excessive conversation.  She denies any issues with exertional tolerance.  She has a mild cough at this time, but states this did not occur until after her upper endoscopy, and is now associated with a sore throat.  She has never been diagnosed with asthma, but she does have a history of smoking a pack a day for 10 years.  She has had a CT of her chest which shows totally clear lung fields, and no chest wall abnormality.  It did show a slight prominence of her left costochondral cartilage.  She denies any pleuritic chest pain, or lower extremity edema.  She tells me that she is being evaluated by her primary doctor for autoimmune disease.   Review of Systems  Constitutional: Positive for unexpected weight change. Negative for fever.  HENT: Positive for ear pain. Negative for nosebleeds, congestion, sore throat, rhinorrhea, sneezing, trouble swallowing, dental problem, postnasal drip and sinus pressure.   Eyes: Negative for redness and itching.  Respiratory: Positive for shortness of breath. Negative for cough and wheezing.   Cardiovascular: Positive for chest pain and palpitations. Negative for leg swelling.  Gastrointestinal: Negative for nausea and vomiting.  Genitourinary: Negative for  dysuria.  Musculoskeletal: Negative for joint swelling.  Skin: Positive for rash ( itching -- legs/face).  Neurological: Positive for headaches.  Hematological: Does not bruise/bleed easily.  Psychiatric/Behavioral: Negative for dysphoric mood. The patient is not nervous/anxious.        Objective:   Physical Exam Constitutional:  Thin female in nad, no acute distress  HENT:  Nares patent without discharge  Oropharynx without exudate, palate and uvula are normal  Eyes:  Perrla, eomi, no scleral icterus  Neck:  No JVD, no TMG  Cardiovascular:  Normal rate, regular rhythm, no rubs or gallops.  No murmurs        Intact distal pulses  Pulmonary :  Normal breath sounds, no stridor or respiratory distress   No rales, rhonchi, or wheezing  Abdominal:  Soft, nondistended, bowel sounds present.  No tenderness noted.   Musculoskeletal:  No lower extremity edema noted.  Lymph Nodes:  No cervical lymphadenopathy noted  Skin:  No cyanosis noted  Neurologic:  Alert, appropriate, moves all 4 extremities without obvious deficit.         Assessment & Plan:

## 2012-12-13 ENCOUNTER — Ambulatory Visit (HOSPITAL_COMMUNITY)
Admission: RE | Admit: 2012-12-13 | Discharge: 2012-12-13 | Disposition: A | Discharge status: Home / Self Care | Payer: BC Managed Care – PPO | Source: Ambulatory Visit | Attending: Pulmonary Disease | Admitting: Pulmonary Disease

## 2012-12-13 ENCOUNTER — Encounter: Payer: Self-pay | Admitting: Pulmonary Disease

## 2012-12-13 ENCOUNTER — Ambulatory Visit (INDEPENDENT_AMBULATORY_CARE_PROVIDER_SITE_OTHER): Payer: BC Managed Care – PPO | Admitting: Pulmonary Disease

## 2012-12-13 VITALS — BP 110/60 | HR 70 | Temp 98.3°F | Ht 70.0 in | Wt 155.6 lb

## 2012-12-13 DIAGNOSIS — R0609 Other forms of dyspnea: Secondary | ICD-10-CM | POA: Insufficient documentation

## 2012-12-13 DIAGNOSIS — R0602 Shortness of breath: Secondary | ICD-10-CM | POA: Insufficient documentation

## 2012-12-13 DIAGNOSIS — R0989 Other specified symptoms and signs involving the circulatory and respiratory systems: Secondary | ICD-10-CM | POA: Insufficient documentation

## 2012-12-13 DIAGNOSIS — R062 Wheezing: Secondary | ICD-10-CM | POA: Insufficient documentation

## 2012-12-13 LAB — PULMONARY FUNCTION TEST

## 2012-12-13 MED ORDER — ALBUTEROL SULFATE (5 MG/ML) 0.5% IN NEBU
2.5000 mg | INHALATION_SOLUTION | Freq: Once | RESPIRATORY_TRACT | Status: AC
  Administered 2012-12-13: 2.5 mg via RESPIRATORY_TRACT

## 2012-12-13 NOTE — Assessment & Plan Note (Signed)
The patient has normal PFTs, normal CT chest, and normal lung exam.  I can find nothing from a pulmonary standpoint to explain her intermittent and atypical dyspnea.  Given her myriad of symptoms, I do think she needs an autoimmune work up either through her primary care physician or a rheumatologist.  I think she would also benefit from an echocardiogram to evaluate pulmonary artery pressures.

## 2012-12-13 NOTE — Progress Notes (Signed)
  Subjective:    Patient ID: Anita George, female    DOB: 17-Dec-1980, 32 y.o.   MRN: [id number]  HPI The patient comes in today for followup of her pulmonary function studies, done as part of a workup for intermittent dyspnea.  Her lung volumes were totally normal, as was her diffusion capacity.  Her spirometry was abnormal, but when looking at her flow volume loops, they clearly showed inadequate technique and were not reliable.  We have repeated her spirometry here, and have gotten good results with a normal flow volume loop and no evidence for airflow obstruction.   Review of Systems  Constitutional: Negative for fever and unexpected weight change.  HENT: Negative for ear pain, nosebleeds, congestion, sore throat, rhinorrhea, sneezing, trouble swallowing, dental problem, postnasal drip and sinus pressure.   Eyes: Negative for redness and itching.  Respiratory: Negative for cough, chest tightness, shortness of breath and wheezing.   Cardiovascular: Negative for palpitations and leg swelling.  Gastrointestinal: Negative for nausea and vomiting.  Genitourinary: Negative for dysuria.  Musculoskeletal: Negative for joint swelling.  Skin: Negative for rash.  Neurological: Negative for headaches.  Hematological: Does not bruise/bleed easily.  Psychiatric/Behavioral: Negative for dysphoric mood. The patient is not nervous/anxious.        Objective:   Physical Exam Thin female in no acute distress Nose without purulence or discharge noted Neck without lymphadenopathy or thyromegaly Chest totally clear to auscultation Lower extremities without edema, no cyanosis Alert and oriented, moves all 4 extremities.       Assessment & Plan:

## 2012-12-13 NOTE — Patient Instructions (Addendum)
Will send a note to your primary physician recommending an autoimmune workup and also an ultrasound of your heart to evaluate your heart pressures and to look at your valves.  followup with me if any new breathing symptoms.

## 2012-12-26 ENCOUNTER — Telehealth (INDEPENDENT_AMBULATORY_CARE_PROVIDER_SITE_OTHER): Payer: Self-pay | Admitting: *Deleted

## 2012-12-26 NOTE — Telephone Encounter (Signed)
Anita George called stating she is still sick with constantly nauseated, no apatite/not eating, food doesn't taste good, losing weight and stomach pain with cramping. Could she have Crohn's? Look at labs dated 10/05/12. The return phone number is [phone].

## 2012-12-27 NOTE — Telephone Encounter (Signed)
No answer at home yesterday

## 2012-12-27 NOTE — Telephone Encounter (Signed)
Message left at home 

## 2012-12-28 NOTE — Telephone Encounter (Signed)
Per Alden Server, patient will be transferring to Dr Teena Dunk

## 2012-12-28 NOTE — Telephone Encounter (Signed)
She apparently called back and has decided to transfer her records to Dr. Teena Dunk. Please note that when patient was seen in the office, I had Dr. Karilyn Cota in attendance during her visit.  She was offered an EGD today for her symptoms of nausea and epigastric pain. She did sound sedated on the phone.

## 2012-12-28 NOTE — Telephone Encounter (Signed)
Patient called to let Terri know that her PCP is sending her to Dr. Teena Dunk in [place] and to please forget the test she has scheduled. She told her PCP that Terri said her problem was not GI related. Asked Zalika if she was aware that going to Dr. Starr Lake office could cause her to be discharged from our practice. She could not go between/back and forth from GI to GI office. She was not, then said she would be going to Dr. Teena Dunk.

## 2012-12-28 NOTE — Telephone Encounter (Signed)
She tells me she continues to have epigastric pain. She has nausea. She has lost 10 pounds since her last visit.    Please schedule an EGD with Dr. Karilyn Cota.

## 2013-01-12 DIAGNOSIS — R079 Chest pain, unspecified: Secondary | ICD-10-CM | POA: Insufficient documentation

## 2013-01-17 DIAGNOSIS — M2550 Pain in unspecified joint: Secondary | ICD-10-CM | POA: Insufficient documentation

## 2013-02-15 DIAGNOSIS — Z8739 Personal history of other diseases of the musculoskeletal system and connective tissue: Secondary | ICD-10-CM | POA: Insufficient documentation

## 2013-04-06 ENCOUNTER — Emergency Department (HOSPITAL_COMMUNITY)
Admission: EM | Admit: 2013-04-06 | Discharge: 2013-04-06 | Disposition: A | Discharge status: Home / Self Care | Payer: BC Managed Care – PPO | Attending: Emergency Medicine | Admitting: Emergency Medicine

## 2013-04-06 ENCOUNTER — Encounter (HOSPITAL_COMMUNITY): Payer: Self-pay

## 2013-04-06 ENCOUNTER — Emergency Department: Payer: Self-pay | Admitting: Emergency Medicine

## 2013-04-06 DIAGNOSIS — R112 Nausea with vomiting, unspecified: Secondary | ICD-10-CM | POA: Insufficient documentation

## 2013-04-06 DIAGNOSIS — R079 Chest pain, unspecified: Secondary | ICD-10-CM | POA: Insufficient documentation

## 2013-04-06 DIAGNOSIS — H9209 Otalgia, unspecified ear: Secondary | ICD-10-CM | POA: Insufficient documentation

## 2013-04-06 DIAGNOSIS — Z8679 Personal history of other diseases of the circulatory system: Secondary | ICD-10-CM | POA: Insufficient documentation

## 2013-04-06 DIAGNOSIS — J3489 Other specified disorders of nose and nasal sinuses: Secondary | ICD-10-CM | POA: Insufficient documentation

## 2013-04-06 DIAGNOSIS — R1110 Vomiting, unspecified: Secondary | ICD-10-CM

## 2013-04-06 DIAGNOSIS — M549 Dorsalgia, unspecified: Secondary | ICD-10-CM | POA: Insufficient documentation

## 2013-04-06 DIAGNOSIS — Z8739 Personal history of other diseases of the musculoskeletal system and connective tissue: Secondary | ICD-10-CM | POA: Insufficient documentation

## 2013-04-06 DIAGNOSIS — H9210 Otorrhea, unspecified ear: Secondary | ICD-10-CM | POA: Insufficient documentation

## 2013-04-06 DIAGNOSIS — Z87891 Personal history of nicotine dependence: Secondary | ICD-10-CM | POA: Insufficient documentation

## 2013-04-06 DIAGNOSIS — Z3202 Encounter for pregnancy test, result negative: Secondary | ICD-10-CM | POA: Insufficient documentation

## 2013-04-06 DIAGNOSIS — Z79899 Other long term (current) drug therapy: Secondary | ICD-10-CM | POA: Insufficient documentation

## 2013-04-06 HISTORY — DX: Ankylosing spondylitis of unspecified sites in spine: M45.9

## 2013-04-06 LAB — URINALYSIS, ROUTINE W REFLEX MICROSCOPIC
Glucose, UA: NEGATIVE mg/dL
Ketones, ur: NEGATIVE mg/dL
Leukocytes, UA: NEGATIVE
Nitrite: NEGATIVE
Specific Gravity, Urine: 1.015 (ref 1.005–1.030)
pH: 6.5 (ref 5.0–8.0)

## 2013-04-06 LAB — COMPREHENSIVE METABOLIC PANEL
Albumin: 3.9 g/dL (ref 3.4–5.0)
BUN: 7 mg/dL (ref 7–18)
Bilirubin,Total: 1 mg/dL (ref 0.2–1.0)
Chloride: 104 mmol/L (ref 98–107)
Creatinine: 0.66 mg/dL (ref 0.60–1.30)
EGFR (African American): >60
EGFR (Non-African Amer.): >60
Potassium: 3.9 mmol/L (ref 3.5–5.1)
SGOT(AST): 27 U/L (ref 15–37)
Total Protein: 8.2 g/dL (ref 6.4–8.2)

## 2013-04-06 LAB — CBC
HCT: 41.8 % (ref 35.0–47.0)
MCH: 29 pg (ref 26.0–34.0)
MCHC: 34.5 g/dL (ref 32.0–36.0)
RDW: 13 % (ref 11.5–14.5)
WBC: 8.1 10*3/uL (ref 3.6–11.0)

## 2013-04-06 LAB — URINALYSIS, COMPLETE
Blood: NEGATIVE
Nitrite: NEGATIVE
Ph: 6 (ref 4.5–8.0)
Protein: NEGATIVE
RBC,UR: 1 /HPF (ref 0–5)
Specific Gravity: 1.012 (ref 1.003–1.030)

## 2013-04-06 LAB — PREGNANCY, URINE: Preg Test, Ur: NEGATIVE

## 2013-04-06 MED ORDER — ONDANSETRON HCL 4 MG PO TABS: 4.0000 mg | ORAL_TABLET | Freq: Three times a day (TID) | ORAL | Status: DC | PRN

## 2013-04-06 NOTE — ED Provider Notes (Signed)
CSN: [id number]     Arrival date & time 04/06/13  4696 History    This chart was scribed for Anita B. Bernette Mayers, MD by Leone Payor, ED Scribe. This patient was seen in room APA08/[facility]08 and the patient's care was started 8:41 AM.   First MD Initiated Contact with Patient 04/06/13 0830     Chief Complaint  Patient presents with  . Emesis    The history is provided by the patient. No language interpreter was used.    HPI Comments: Anita George is a 32 y.o. female who presents to the Emergency Department complaining of chronic nausea and vomiting that has been ongoing for 1 year but worsened in the past 2 months. Pt also has bilateral ear pain that has been ongoing for about 1 year. States she has seen an ENT doctor who told her she had ruptured bilateral TMs and now has been noticing fluid leakage. Pt also reports having nasal "leakage" for about 2 months. She also complains of back and rib pain that has been ongoing for months. She was seen by a rheumatologist and was diagnosed with "AS". Pt is concerned she is having spinal fluid leakage. States her PCP will not refer to a neurologist. Denies dysuria.  Past Medical History  Diagnosis Date  . Shortness of breath   . Palpitations   . Fibromyalgia   . Pericardial effusion   . Ventricular tachycardia     nonsutained by cardiac monitor November 21, 2008 no other arrhythmias and negative workup for structural heart diseas e(normal echo)  . AS (ankylosing spondylitis)    Past Surgical History  Procedure Laterality Date  . No past surgeries     Family History  Problem Relation Age of Onset  . Stroke Other   . Hypertension Other   . Hyperlipidemia Other    History  Substance Use Topics  . Smoking status: Former Smoker -- 2.00 packs/day for 11 years    Types: Cigarettes    Quit date: 09/08/2007  . Smokeless tobacco: Not on file     Comment: quit  4 yrs ago after smoking 10 yr. 1 pack a day  . Alcohol Use: Yes     Comment: No etoh  in 2-3 yrs.  Weekend she use to drink beer and liquor   OB History   Grav Para Term Preterm Abortions TAB SAB Ect Mult Living                 Review of Systems  HENT: Positive for ear pain and ear discharge.   Gastrointestinal: Positive for nausea.  Musculoskeletal: Positive for back pain.  All other systems reviewed and are negative.    Allergies  Cephalosporins  Home Medications   Current Outpatient Rx  Name  Route  Sig  Dispense  Refill  . baclofen (LIORESAL) 10 MG tablet   Oral   Take 10 mg by mouth at bedtime.         . buprenorphine (BUTRANS) 10 MCG/HR PTWK   Transdermal   Place 10 mcg onto the skin once a week.         . drospirenone-ethinyl estradiol (ZARAH) 3-0.03 MG per tablet   Oral   Take 1 tablet by mouth daily.           Marland Kitchen HYDROcodone-acetaminophen (NORCO) 5-325 MG per tablet   Oral   Take 1 tablet by mouth daily as needed. Take 1/2-1 tab          . hyoscyamine (  ANASPAZ) 0.125 MG TBDP   Sublingual   Place 0.125 mg under the tongue daily.         . metoprolol tartrate (LOPRESSOR) 25 MG tablet      TAKE ONE & ONE-HALF TABLETS BY MOUTH TWICE DAILY   90 tablet   0     PATIENT NEED OFFICE VISIT WITH DEGENT/CALL OFFICE  .Marland Kitchen.   . pantoprazole (PROTONIX) 40 MG tablet   Oral   Take 40 mg by mouth daily.         . polyethylene glycol (MIRALAX / GLYCOLAX) packet   Oral   Take 17 g by mouth daily.          BP 124/61  Pulse 60  Temp(Src) 97.8 F (36.6 C) (Oral)  Resp 18  Ht 5\' 11"  (1.803 m)  Wt 150 lb (68.04 kg)  BMI 20.93 kg/m2  SpO2 100%  LMP 03/30/2013 Physical Exam  Nursing note and vitals reviewed. Constitutional: She is oriented to person, place, and time. She appears well-developed and well-nourished.  HENT:  Head: Normocephalic and atraumatic.  Scar tissue to bilateral TMs.   Eyes: EOM are normal. Pupils are equal, round, and reactive to light.  Neck: Normal range of motion. Neck supple.  Cardiovascular: Normal rate,  normal heart sounds and intact distal pulses.   Pulmonary/Chest: Effort normal and breath sounds normal.  Abdominal: Bowel sounds are normal. She exhibits no distension. There is no tenderness.  Musculoskeletal: Normal range of motion. She exhibits no edema and no tenderness.  Neurological: She is alert and oriented to person, place, and time. She has normal strength. No cranial nerve deficit or sensory deficit.  Skin: Skin is warm and dry. No rash noted.  Psychiatric: She has a normal mood and affect.    ED Course   Procedures (including critical care time)  DIAGNOSTIC STUDIES: Oxygen Saturation is 100% on RA, normal by my interpretation.    COORDINATION OF CARE: 8:41 AM Discussed treatment plan with pt at bedside and pt agreed to plan.   Labs Reviewed  URINALYSIS, ROUTINE W REFLEX MICROSCOPIC - Abnormal; Notable for the following:    Hgb urine dipstick TRACE (*)    All other components within normal limits  URINE MICROSCOPIC-ADD ON - Abnormal; Notable for the following:    Squamous Epithelial / LPF FEW (*)    All other components within normal limits  PREGNANCY, URINE  POCT PREGNANCY, URINE   No results found. 1. Chronic vomiting     MDM  Pt with benign exam. Advised that we could evaluate her for dehydration in the setting of chronic vomiting but that additional testing she may require, including neurology eval would need to be done as an outpatient. She is refusing any additional ED workup or treatment. Offered referral to a Neurologist. She requests "[name]" in [place]. Normal vitals, normal UA, no clinical signs of dehydration or CSF leak.   I personally performed the services described in this documentation, which was scribed in my presence. The recorded information has been reviewed and is accurate.     Anita B. Bernette Mayers, MD 04/06/13 1020

## 2013-04-06 NOTE — ED Notes (Signed)
Pt reports vomiting episodes x 1 year.  Reports vomiting has been worse the past 2 days.  Also c/o bilateral ear pain and fluid leaking from ears and nose.

## 2013-04-25 DIAGNOSIS — M797 Fibromyalgia: Secondary | ICD-10-CM | POA: Insufficient documentation

## 2014-02-08 ENCOUNTER — Ambulatory Visit (HOSPITAL_COMMUNITY)
Admission: RE | Admit: 2014-02-08 | Discharge: 2014-02-08 | Disposition: A | Discharge status: Home / Self Care | Payer: BC Managed Care – PPO | Source: Ambulatory Visit | Attending: Neurology | Admitting: Neurology

## 2014-02-08 ENCOUNTER — Other Ambulatory Visit: Payer: Self-pay | Admitting: Neurology

## 2014-02-08 DIAGNOSIS — M545 Low back pain, unspecified: Secondary | ICD-10-CM | POA: Insufficient documentation

## 2014-02-08 DIAGNOSIS — M79609 Pain in unspecified limb: Secondary | ICD-10-CM | POA: Insufficient documentation

## 2014-02-08 IMAGING — CR DG LUMBAR SPINE COMPLETE 4+V
5 series · 5 of 5 positions shown · non-contrast
Comparison: Sagittal and coronal reformatted images CT abdomen
pelvis [DATE].

CLINICAL DATA: Low back pain going down right leg.

EXAM:
LUMBAR SPINE - COMPLETE 4+ VIEW

[view not recorded (1 of 5)]
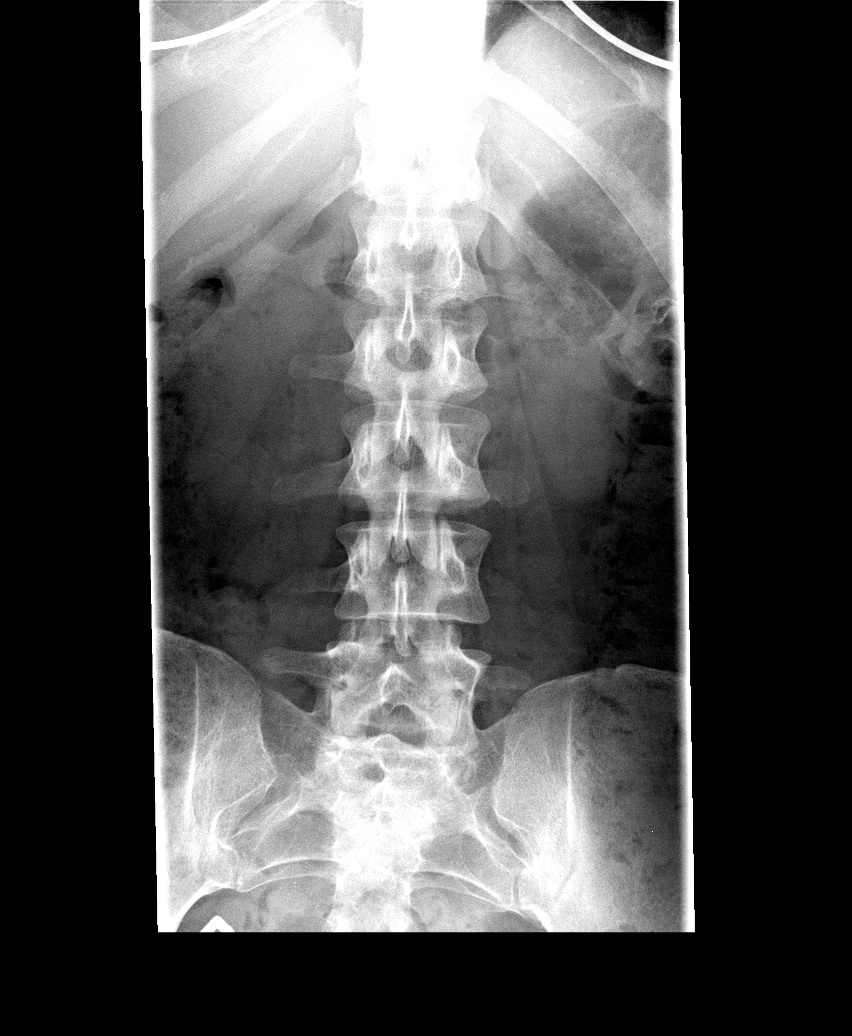

[view not recorded (2 of 5)]
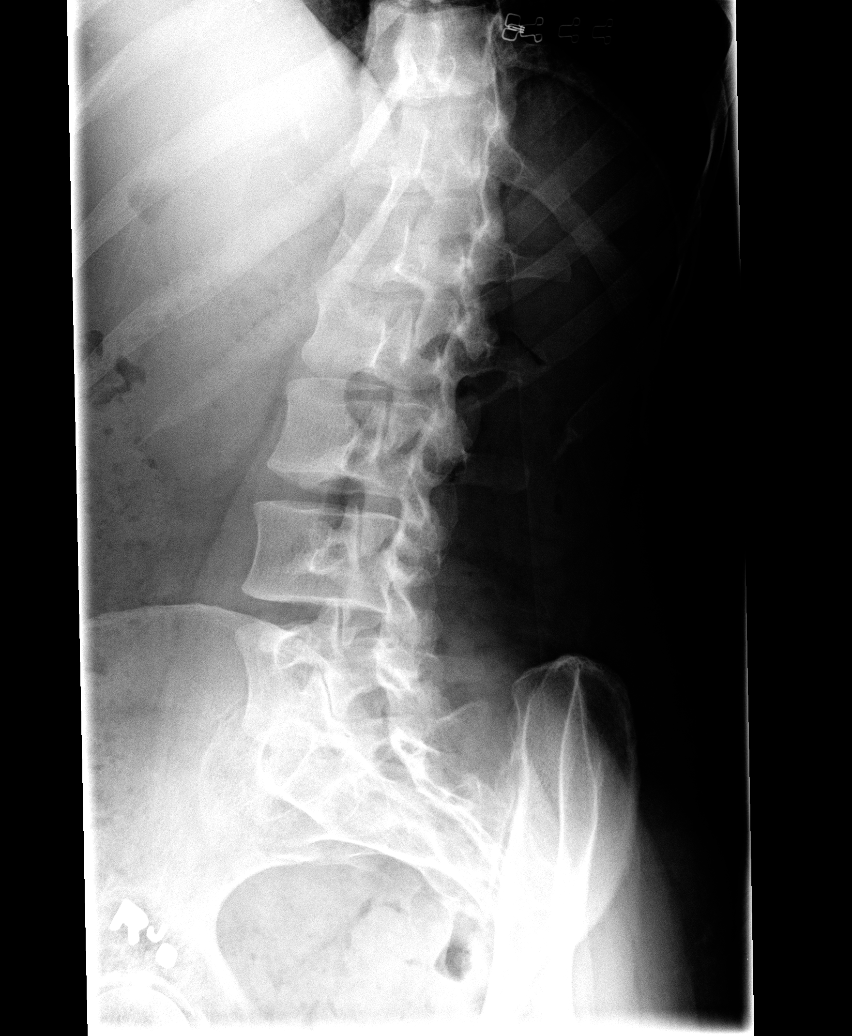

[view not recorded (3 of 5)]
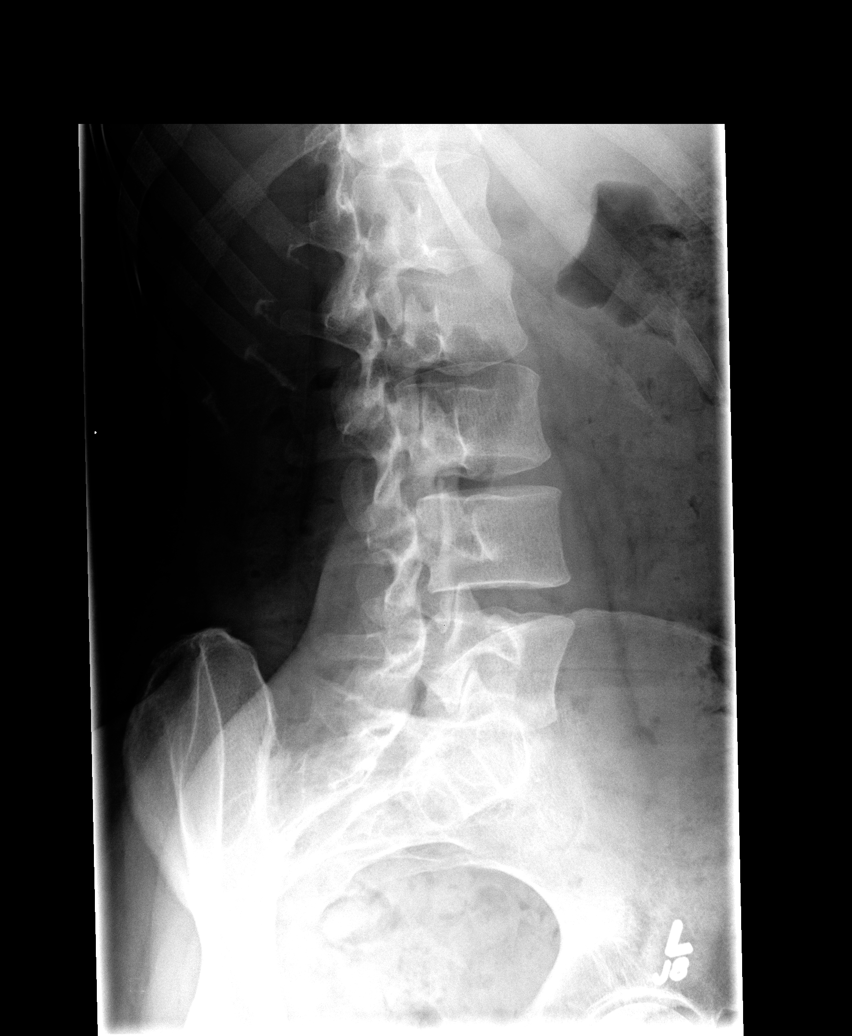

[view not recorded (4 of 5)]
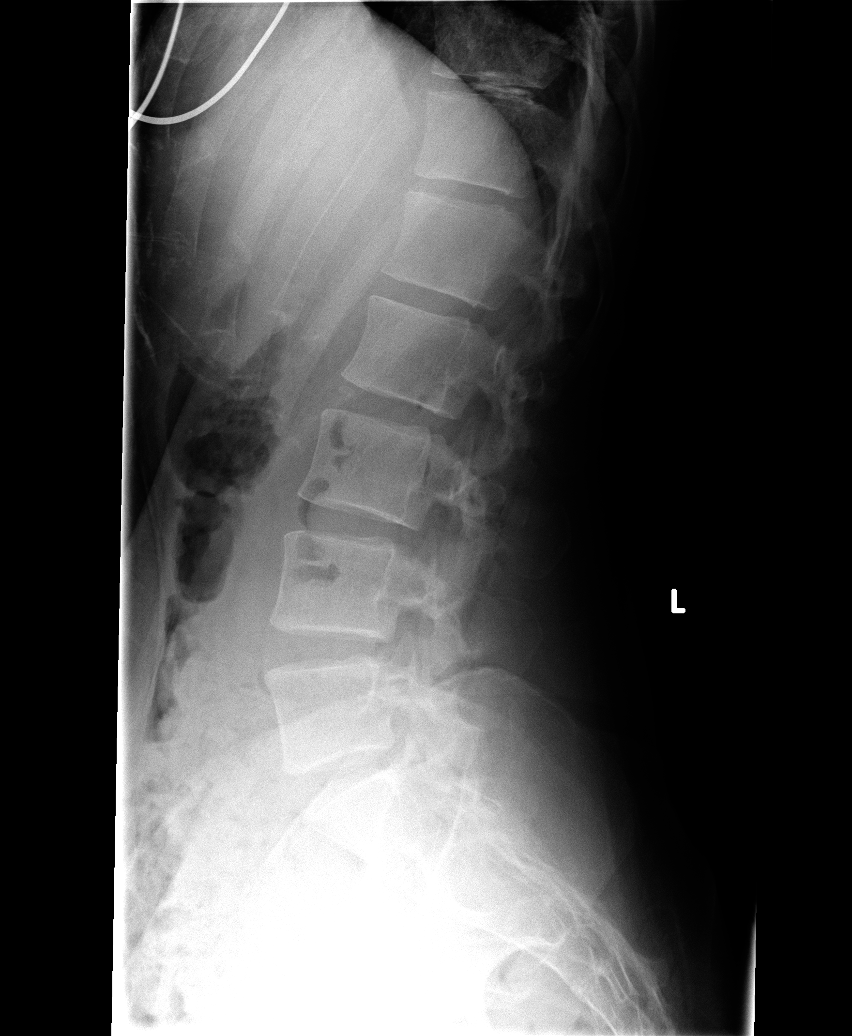

[view not recorded (5 of 5)]
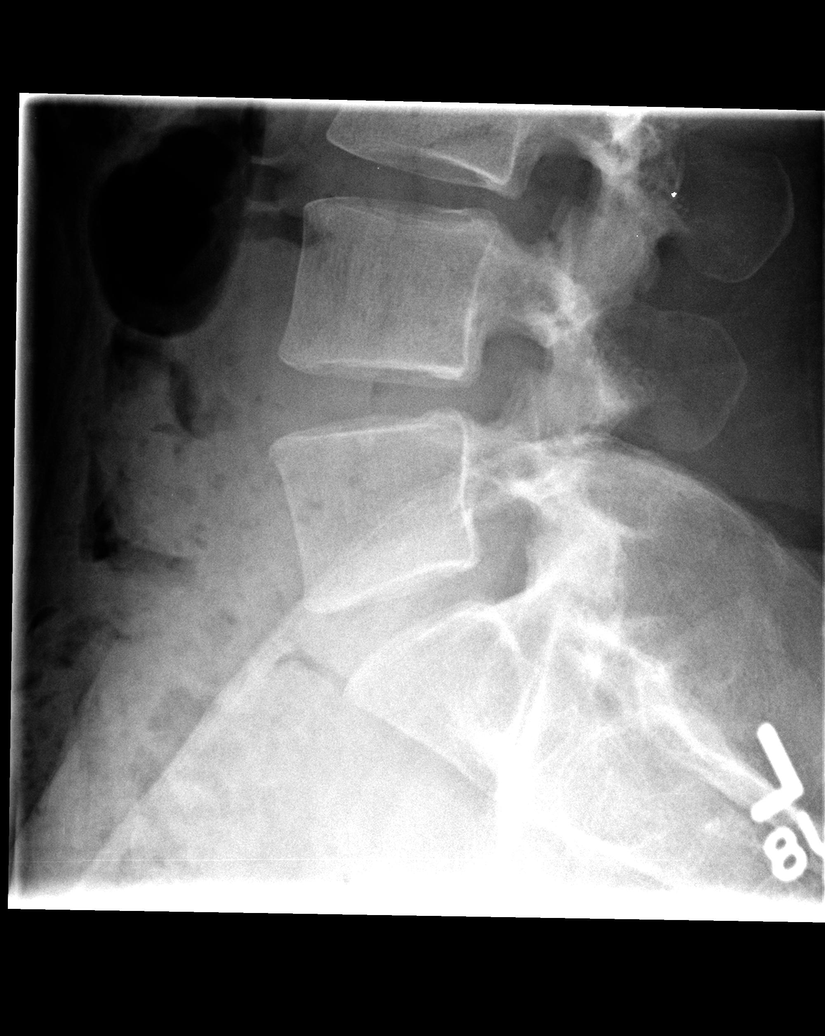

[5 of 5 positions shown; findings below may reference images not displayed]

FINDINGS: Alignment is anatomic. Vertebral body and disc space heights are
maintained. No definite degenerative changes. No pars defects.
IMPRESSION: No findings to explain the patient's pain.

## 2014-12-06 DIAGNOSIS — K900 Celiac disease: Secondary | ICD-10-CM | POA: Insufficient documentation

## 2015-04-25 DIAGNOSIS — R768 Other specified abnormal immunological findings in serum: Secondary | ICD-10-CM | POA: Insufficient documentation

## 2015-05-22 ENCOUNTER — Ambulatory Visit (INDEPENDENT_AMBULATORY_CARE_PROVIDER_SITE_OTHER): Payer: 59 | Admitting: Psychiatry

## 2015-05-22 ENCOUNTER — Encounter (HOSPITAL_COMMUNITY): Payer: Self-pay | Admitting: Psychiatry

## 2015-05-22 VITALS — BP 101/55 | HR 45 | Ht 70.0 in | Wt 143.8 lb

## 2015-05-22 DIAGNOSIS — F331 Major depressive disorder, recurrent, moderate: Secondary | ICD-10-CM | POA: Diagnosis not present

## 2015-05-22 DIAGNOSIS — F329 Major depressive disorder, single episode, unspecified: Secondary | ICD-10-CM | POA: Insufficient documentation

## 2015-05-22 MED ORDER — DULOXETINE HCL 60 MG PO CPEP: 60.0000 mg | ORAL_CAPSULE | Freq: Every day | ORAL | Status: DC
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
## 2015-05-28 ENCOUNTER — Telehealth (HOSPITAL_COMMUNITY): Payer: Self-pay | Admitting: *Deleted

## 2015-05-28 ENCOUNTER — Other Ambulatory Visit (HOSPITAL_COMMUNITY): Payer: Self-pay | Admitting: Psychiatry

## 2015-05-28 MED ORDER — FLUOXETINE HCL 20 MG PO TABS: 20.0000 mg | ORAL_TABLET | Freq: Every day | ORAL | Status: DC

## 2015-05-28 NOTE — Telephone Encounter (Signed)
Please route to [facility] to see where script was sent. Also to inform pt that this is best medication to help both depression and chronic pain.

## 2015-05-28 NOTE — Telephone Encounter (Signed)
Called pt to inform her what Dr. Ross stated. Per pt, she is not working right now and she is in the process of getting disability. Per pt, she would like to see if Dr. Ross to prescribe a different medication that is not that expensive. Per pt, she can barely pay $10.00 for her pain medication from her pain management clinic. Pt number is [phone] 

## 2015-05-28 NOTE — Telephone Encounter (Signed)
Called pt to inform her what Dr. Tenny Craw stated. Per pt, she is not working right now and she is in the process of getting disability. Per pt, she would like to see if Dr. Tenny Craw to prescribe a different medication that is not that expensive. Per pt, she can barely pay $10.00 for her pain medication from her pain management clinic. Pt number is [phone]

## 2015-05-28 NOTE — Telephone Encounter (Signed)
prozac sent in

## 2015-05-28 NOTE — Telephone Encounter (Signed)
was seen last week, Cymbalta should go to [facility].  $80 with insurance, so she would like to change to something else.

## 2015-05-31 NOTE — Telephone Encounter (Signed)
noted 

## 2015-06-18 ENCOUNTER — Encounter (HOSPITAL_COMMUNITY): Payer: Self-pay | Admitting: Psychiatry

## 2015-06-18 ENCOUNTER — Ambulatory Visit (HOSPITAL_COMMUNITY): Payer: Self-pay | Admitting: Psychiatry

## 2015-08-05 ENCOUNTER — Ambulatory Visit (INDEPENDENT_AMBULATORY_CARE_PROVIDER_SITE_OTHER): Payer: 59 | Admitting: Neurology

## 2015-08-05 ENCOUNTER — Encounter: Payer: Self-pay | Admitting: Neurology

## 2015-08-05 ENCOUNTER — Telehealth: Payer: Self-pay | Admitting: Neurology

## 2015-08-05 VITALS — BP 93/47 | HR 63 | Ht 70.0 in | Wt 149.6 lb

## 2015-08-05 DIAGNOSIS — M542 Cervicalgia: Secondary | ICD-10-CM | POA: Insufficient documentation

## 2015-08-05 DIAGNOSIS — H539 Unspecified visual disturbance: Secondary | ICD-10-CM | POA: Diagnosis not present

## 2015-08-05 DIAGNOSIS — R51 Headache: Secondary | ICD-10-CM

## 2015-08-05 DIAGNOSIS — R202 Paresthesia of skin: Secondary | ICD-10-CM | POA: Diagnosis not present

## 2015-08-05 DIAGNOSIS — R471 Dysarthria and anarthria: Secondary | ICD-10-CM

## 2015-08-05 DIAGNOSIS — G8929 Other chronic pain: Secondary | ICD-10-CM

## 2015-08-05 DIAGNOSIS — R519 Headache, unspecified: Secondary | ICD-10-CM

## 2015-08-05 DIAGNOSIS — G35 Multiple sclerosis: Secondary | ICD-10-CM

## 2015-08-05 DIAGNOSIS — G252 Other specified forms of tremor: Secondary | ICD-10-CM

## 2015-08-05 DIAGNOSIS — R339 Retention of urine, unspecified: Secondary | ICD-10-CM | POA: Diagnosis not present

## 2015-08-05 DIAGNOSIS — H9193 Unspecified hearing loss, bilateral: Secondary | ICD-10-CM

## 2015-08-05 DIAGNOSIS — R29898 Other symptoms and signs involving the musculoskeletal system: Secondary | ICD-10-CM

## 2015-08-05 DIAGNOSIS — H532 Diplopia: Secondary | ICD-10-CM

## 2015-08-05 DIAGNOSIS — R531 Weakness: Secondary | ICD-10-CM | POA: Diagnosis not present

## 2015-08-05 NOTE — Telephone Encounter (Signed)
Patient declined MRI BRAIN W/WO, MRI C-SPINE W/WO & MRI T-SPINE W/WO due to her not being able to afford the copay. She will check with other facilities to see what the difference on her copay will be. Thanks!

## 2015-08-05 NOTE — Progress Notes (Addendum)
[facility]    Provider:  Dr Jaynee Eagles Referring Provider: Monico Blitz, MD Primary Care Physician:  Rory Percy, MD  CC:  Patient states she has been diagnosed with probable MS  HPI:  Anita George is a 34 y.o. female here as a referral from Dr. Manuella Ghazi for myalgias and paresthesias. She has a PMHx of JRA, fibromyalgia, "water on the brain" and is on diamox and topiramate. She has vision loss and blurry vision. She has multiple symptoms. From the age of 66 to 47 her RA worsened with swelling in the joints. 3 years ago she passed out on the kitchen floor and fluid started pouring out of her nose. She was having nausea and vomiting daily which lasted for 6 month to a year and she lost 50 pounds in 3 months. 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 She was seen by another neurologist in [place] by dr Berdine Addison and she was told she had "fluid on her brain" and was started on Diamox and Topamax for headaches. She is still on Diamox and Topamax. She never saw neurology again. She was evaluated by neurosurgery. She has been to "every doctor under the sun" and she has been told that she has MS or she has Lupus. She would like to be evaluated for MS. Her current symptoms are eye pain, eye blurriness, eye cloudiness. The medication she is on currently help her headaches. A few times a week she has pounding in the forehead to the temple areas, no light sensitivity, no sound sensitivity, she has "every problem with her ears she can have", she hears swishing in the ears and now wears ear plugs constantly. She has a lot of weakness and sometimes to hold her phone tires her out and she has to look at her phone while on her lap cause she cant lift it. It is fatigue that is symmetric and in all the extremities, no proximal or distal feautures just weakness all over. She describes double vision, she has episodes of loss of vision for 5 seconds a few  times a week. She has not been to the opthalmologist for evaluation. She has tremors inside deep inside and is shaking which is painful. She has numbness and tingling in the feet and toes and sometimes her hands will fall asleep when she is asleep but this is occassional. She has a lot of neck pain and low back pain. She has urinary retention, she can't urinate. She has episodes of vision blurriness. She had an episode of shaking all over and it looke dlike she was dancing but she remembers it all, she was shaking uncontrollable, it lasted for 20 -30 minutes, she was completely aware and husband says it looked like she was "dancing". She has been extensively evaluated and thyroid has been normal. She had an episode of the worst pain of her life inher legs that lasted 2 hours.  Reviewed notes, labs and imaging from outside physicians, which showed:  anca neg Ck 81 Neg hep B and Csed 20 nml tsh hiv neg +ANA 1:80 speckled Normal: aldolase, alt, anca, anti-ccp, dsdna, ast, c3,c4,crp, ena, rf,ck(66),cryoglobulin,  EXAM: personally reviewed images LUMBAR SPINE - COMPLETE 4+ VIEW  COMPARISON: Sagittal and coronal reformatted images CT abdomen pelvis 12/01/2012.  FINDINGS: Alignment is anatomic. Vertebral body and disc space heights are maintained. No definite degenerative changes. No pars defects.  CMP unremarkable    Review of Systems: Patient complains of  symptoms per HPI as well as the following symptoms: no CP, no SOB. Pertinent negatives per HPI. All others negative.   Social History   Social History  . Marital Status: Married    Spouse Name: [name]  . Number of Children: 0  . Years of Education: 16+   Occupational History  . Not on file.   Social History Main Topics  . Smoking status: Former Smoker -- 2.00 packs/day for 11 years    Types: Cigarettes    Quit date: 09/08/2007  . Smokeless tobacco: Not on file     Comment: quit  4 yrs ago after smoking 10 yr. 1 pack a day    . Alcohol Use: No     Comment: No etoh in 2-3 yrs.  Weekend she use to drink beer and liquor  . Drug Use: No  . Sexual Activity: Yes   Other Topics Concern  . Not on file   Social History Narrative   Lives at home with husband.   Caffeine use: Drinks coffee   Rarely drinks diet soda (1 can per day)    Family History  Problem Relation Age of Onset  . Stroke Other   . Hypertension Other   . Hyperlipidemia Other   . Depression Mother   . Drug abuse Mother   . Alcohol abuse Mother   . Depression Father   . Depression Sister   . Depression Brother   . Drug abuse Brother   . Alcohol abuse Brother     Past Medical History  Diagnosis Date  . Shortness of breath   . Palpitations   . Fibromyalgia   . Pericardial effusion   . Ventricular tachycardia ([place])     nonsutained by cardiac monitor November 21, 2008 no other arrhythmias and negative workup for structural heart diseas e(normal echo)  . AS (ankylosing spondylitis) ([facility])   . Arnold-Chiari malformation ([place])   . Lupus (systemic lupus erythematosus) ([place])   . 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 Depression   . Headache   . Fatigue     Past Surgical History  Procedure Laterality Date  . No past surgeries      Current Outpatient Prescriptions  Medication Sig Dispense Refill  . acetaZOLAMIDE (DIAMOX) 250 MG tablet Take 250 mg by mouth. Taking two Tablets in Am and 2 Tablets at Night    . fentaNYL (DURAGESIC - DOSED MCG/HR) 100 MCG/HR Place 100 mcg onto the skin every 3 (three) days.    Marland Kitchen gabapentin (NEURONTIN) 300 MG capsule Take 300 mg by mouth daily. 3 tablets at bedtime    . Levonorgestrel (SKYLA) 13.5 MG IUD by Intrauterine route.    . metoprolol tartrate (LOPRESSOR) 25 MG tablet TAKE ONE & ONE-HALF TABLETS BY MOUTH TWICE DAILY 90 tablet 0  . oxyCODONE-acetaminophen (PERCOCET/ROXICET) 5-325 MG per tablet Take 2 tablets by mouth daily as needed for severe pain.     . polyethylene glycol (MIRALAX / GLYCOLAX) packet  Take 17 g by mouth daily.    Marland Kitchen tiZANidine (ZANAFLEX) 2 MG tablet Take 2 mg by mouth as needed.    . topiramate (TOPAMAX) 100 MG tablet Take 100 mg by mouth. Taking 1 Tablet in Am and 2 tablets in PM     No current facility-administered medications for this visit.    Allergies as of 08/05/2015 - Review Complete 08/05/2015  Allergen Reaction Noted  . Cephalosporins      Vitals: BP 93/47 mmHg  Pulse 63  Ht $R'5\' 10"'ix$  (  1.778 m)  Wt 149 lb 9.6 oz (67.858 kg)  BMI 21.47 kg/m2 Last Weight:  Wt Readings from Last 1 Encounters:  08/05/15 149 lb 9.6 oz (67.858 kg)   Last Height:   Ht Readings from Last 1 Encounters:  08/05/15 $RemoveB'5\' 10"'knoFMiyW$  (1.778 m)   Physical exam: Exam: Gen: NAD, conversant, well nourised, thin, well groomed                     CV: RRR, no MRG. No Carotid Bruits. No peripheral edema, warm, nontender Eyes: Conjunctivae clear without exudates or hemorrhage  Neuro: Detailed Neurologic Exam  Speech:    Speech is normal; fluent and spontaneous with normal comprehension.  Cognition:    The patient is oriented to person, place, and time;     recent and remote memory intact;     language fluent;     normal attention, concentration,     fund of knowledge Cranial Nerves:    The pupils are equal, round, and reactive to light. The fundi are normal and spontaneous venous pulsations are present. Visual fields are full to finger confrontation. Extraocular movements are intact. Trigeminal sensation is intact and the muscles of mastication are normal. The face is symmetric. The palate elevates in the midline. Hearing intact. Voice is normal. Shoulder shrug is normal. The tongue has normal motion without fasciculations.   Coordination:    Normal finger to nose and heel to shin.   Gait:    No ataxia  Motor Observation: mild postural tremor     Tone:    Normal muscle tone.    Posture:    Posture is normal. normal erect    Strength:    Strength is 4/V in the upper and lower  limbs.      Sensation: intact to LT     Reflex Exam:  DTR's:    Deep tendon reflexes in the upper and lower extremities are brisk bilaterally.   Toes:    The toes are downgoing bilaterally.   Clonus:    Clonus is absent.   Assessment/Plan:  34 year old female here as a referral from Dr. Manuella Ghazi for MS. She has a PMHx of JRA. She has been evaluated by multiple neurologists but I do not have any of those records, will request. She says her last MRI of the brain was over 2 years ago and she has had worsening and many new symptoms since then.  She says that she has been diagnosed with probable MS. Her neurologic exam was unremarkable. She has multiple complaints including tremors, vision changes, migraines, hearing loss, dysarthria, weakness, paresthesias, diplopia, urinary retention, chronic neck pain, fevers, chills, malaise, fatigue, rash, arm and leg pain and other complaints.   Will order an MRi of the brain, cervical spine and thoracic spine.   Request records from:  Dr. Lorelee Market. Daneil Dolin, MD  Neurologist  [address], [place], [place] [address]   [phone]  Cc: Ted Mcalpine MD  Addendum: MRI of the brain cervical and thoracic spines performed on 08/14/2015 showed that the cerebellar tonsils project 13-14 mm below the foramen magnum and are pointed with CSF space effacement at the foramen magnum and mild mass effect on the medulla. Otherwise unremarkable appearance of the brain. Normal appearance of the cervical spinal cord. Mild lower cervical spondylosis without stenosis. Broad left paracentral disc protrusion without significant stenosis at C5-C6. Broad central disc protrusion without stenosis at C6-C7 otherwise negative. Normal appearance of  the thoracic spinal cord. No disc herniation or stenosis. Notes from [facility] neurological showed hemoglobin A1c of 5.2. TSH 2.3, B12 1161, folate greater than 20, Lyme negative, RPR nonreactive, vitamin D normal protein  electrophoresis and immunofixation normal CK 55, B1 normal, B6 30.2 which is elevated above the reference range of 21.7 upper limit normal, CSF showed normal cell count and differential, 63 glucose, 43 protein, and no malignant cells or significant inflammation is identified CSF IgG 2.8 which is within the normal limits of 0.8-7.7. MRI of the brain completed on 05/29/2013 showed that the cerebellar tonsils extend 7.2 mm below the foramen magnum down to the level of the C1 arch with crowding of the foramen magnum. EMG/nerve conductions  of the upper extremities were normal. Nerve conductions of the lower extremities shows normal left tibial motor, decreased amplitude of the left peroneal motor at 0.8 V, normal right tibial motor, normal right peroneal motor, normal bilateral sural sensory, left H wave 31.1 ms, right H wave 33.2 ms, normal bilateral F-wave latencies of the tibial and peroneal nerves. 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 No hydrocephalus concerning enhancement. Arnold Chiari I malformation. MRI of the cervical spine on 09/14/2013 showed that the cerebellar tonsils are 8 mm below the foramen magnum. No mass effect on the craniocervical junction and there is no syrinx. Otherwise normal. Routine EEG was normal. No states she was there for multiple neurologic complaints including numbness and tingling in all the extremities, weakness, muscle jerks, fatigue, heavy feeling throughout her body, memory difficulty, word finding difficulty, urinary and bowel hesitancy, nausea, balance difficulty, weight loss, double vision, blurred vision, vision loss with left eye pain, headache and pain throughout her body. Patient was diagnosed with IBS and chronic nausea from a GI specialist. Weight loss was diagnosed as secondary to gluten intolerance. ANA and ESR were normal. The patient was started on Topamax for her headaches. She was  treated for her low back pain. Opening pressure was 24 on lumbar puncture. She was on Topamax 100 mg twice a day, Diamox 250 mg 1-2 tablet at bedtime, tizanidine, imipramine, metoprolol, fluticasone, ibuprofen, lens asked. Lumbar puncture were negative for demyelinating disease.  Sarina Ill, MD  [facility] [address] [place], [place] [address]  Phone [phone] Fax [phone]

## 2015-08-05 NOTE — Patient Instructions (Signed)
Remember to drink plenty of fluid, eat healthy meals and do not skip any meals. Try to eat protein with a every meal and eat a healthy snack such as fruit or nuts in between meals. Try to keep a regular sleep-wake schedule and try to exercise daily, particularly in the form of walking, 20-30 minutes a day, if you can.   As far as diagnostic testing: MRI of the neuroaxis, need records from previous neurology  I would like to see you back after imaging I will call you to discuss, sooner if we need to. Please call us with any interim questions, concerns, problems, updates or refill requests.   Our phone number is [phone]. We also have an after hours call service for urgent matters and there is a physician on-call for urgent questions. For any emergencies you know to call 911 or go to the nearest emergency room

## 2015-08-06 LAB — COMPREHENSIVE METABOLIC PANEL
A/G RATIO: 2 (ref 1.1–2.5)
ALK PHOS: 66 IU/L (ref 39–117)
ALT: 12 IU/L (ref 0–32)
AST: 18 IU/L (ref 0–40)
Albumin: 4.5 g/dL (ref 3.5–5.5)
BUN / CREAT RATIO: 9 (ref 8–20)
BUN: 5 mg/dL — ABNORMAL LOW (ref 6–20)
Bilirubin Total: 1.4 mg/dL — ABNORMAL HIGH (ref 0.0–1.2)
CALCIUM: 9.6 mg/dL (ref 8.7–10.2)
CO2: 21 mmol/L (ref 18–29)
CREATININE: 0.57 mg/dL (ref 0.57–1.00)
Chloride: 106 mmol/L (ref 97–106)
GFR calc Af Amer: 141 mL/min/{1.73_m2} (ref >59)
GFR calc non Af Amer: 122 mL/min/{1.73_m2} (ref >59)
GLOBULIN, TOTAL: 2.3 g/dL (ref 1.5–4.5)
Glucose: 99 mg/dL (ref 65–99)
POTASSIUM: 4.5 mmol/L (ref 3.5–5.2)
SODIUM: 141 mmol/L (ref 136–144)
TOTAL PROTEIN: 6.8 g/dL (ref 6.0–8.5)

## 2015-08-08 ENCOUNTER — Telehealth: Payer: Self-pay | Admitting: *Deleted

## 2015-08-08 NOTE — Telephone Encounter (Signed)
-----   Message from Anson Fret, MD sent at 08/08/2015  7:08 AM EST ----- Let patient know her labs were stable. Her bilirubin was mildly elevated but this appears chronic.  thanks

## 2015-08-08 NOTE — Telephone Encounter (Signed)
Tried calling pt about lab results. Mailbox full and cannot LVM. Will try again later.

## 2015-08-08 NOTE — Telephone Encounter (Signed)
Tried calling number again. No answer. Mailbox full. Could not LVM.

## 2015-08-09 ENCOUNTER — Telehealth: Payer: Self-pay | Admitting: *Deleted

## 2015-08-09 NOTE — Telephone Encounter (Signed)
Release fax over requesting imaging reports.

## 2015-08-12 ENCOUNTER — Telehealth: Payer: Self-pay | Admitting: *Deleted

## 2015-08-12 NOTE — Telephone Encounter (Signed)
Pt returned Emma's call °

## 2015-08-12 NOTE — Telephone Encounter (Signed)
LVM for pt to call about results. Gave [facility] phone number and hours.  

## 2015-08-12 NOTE — Telephone Encounter (Signed)
Called pt back. Relayed results to pt per Dr Lucia Gaskins notes. Pt verbalized understanding.

## 2015-08-12 NOTE — Telephone Encounter (Signed)
Patient imaging reports on [facility] desk.

## 2015-09-04 ENCOUNTER — Telehealth: Payer: Self-pay | Admitting: Neurology

## 2015-09-04 NOTE — Telephone Encounter (Signed)
Pt called requesting MRI results. Pt aware Dr Lucia Gaskins is out of the office until tomorrow and will wait until she returns on Thursday for results

## 2015-09-05 NOTE — Telephone Encounter (Signed)
Please let patient know that the brain and spinal cord did not show an lesions or anything to suspect MS or any other pathologic process; no cause for her symptoms was found on the imaging. The chiari 1 malformation was again seen. Otherwise the brain and spinal cord were normal. Some minimal arthritic changes in the cervical spine without stenosis or nerve root pinching. Absolutely no concern for MS. Would she like a referral to neurosurgery to discuss the chiari malformation?

## 2015-09-05 NOTE — Telephone Encounter (Signed)
LVM for pt to call about results. Gave [facility] phone number and hours.  

## 2015-09-11 NOTE — Telephone Encounter (Signed)
I spoke to her. I reassured her that she does not have MS. I advised her to follow up with her primary care, her therapist and her psychiatrist.

## 2015-09-11 NOTE — Telephone Encounter (Signed)
Called pt again after not hearing back. Explained results per Dr Lucia Gaskins note below. She verbalized understanding but states she is having a lot of sx of MS. Similar to her family member. She is concerned. Expressed again that MRI showed no leasions or anything to suspect MS. She declines referral to neurosurgery at this time. She would like Dr Lucia Gaskins to call her to discuss in more detail. Advised she will give her a call sometime this week. She verbalized understanding.

## 2015-11-28 ENCOUNTER — Telehealth (HOSPITAL_COMMUNITY): Payer: Self-pay | Admitting: *Deleted

## 2015-12-03 ENCOUNTER — Ambulatory Visit (INDEPENDENT_AMBULATORY_CARE_PROVIDER_SITE_OTHER): Payer: BLUE CROSS/BLUE SHIELD | Admitting: Psychiatry

## 2015-12-03 ENCOUNTER — Encounter (HOSPITAL_COMMUNITY): Payer: Self-pay | Admitting: Psychiatry

## 2015-12-03 VITALS — BP 111/71 | HR 70 | Ht 70.0 in | Wt 146.8 lb

## 2015-12-03 DIAGNOSIS — F331 Major depressive disorder, recurrent, moderate: Secondary | ICD-10-CM

## 2015-12-03 MED ORDER — AMITRIPTYLINE HCL 25 MG PO TABS: ORAL_TABLET | ORAL | Status: DC

## 2015-12-03 MED ORDER — METHYLPHENIDATE HCL 10 MG PO TABS: 10.0000 mg | ORAL_TABLET | Freq: Two times a day (BID) | ORAL | Status: DC
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
## 2016-01-01 ENCOUNTER — Ambulatory Visit (HOSPITAL_COMMUNITY): Payer: Self-pay | Admitting: Psychiatry

## 2016-03-02 DIAGNOSIS — G935 Compression of brain: Secondary | ICD-10-CM | POA: Insufficient documentation

## 2016-04-21 ENCOUNTER — Ambulatory Visit (INDEPENDENT_AMBULATORY_CARE_PROVIDER_SITE_OTHER): Payer: BLUE CROSS/BLUE SHIELD | Admitting: Internal Medicine

## 2016-04-21 ENCOUNTER — Encounter (INDEPENDENT_AMBULATORY_CARE_PROVIDER_SITE_OTHER): Payer: Self-pay | Admitting: Internal Medicine

## 2016-04-21 VITALS — BP 92/64 | HR 60 | Temp 98.3°F | Resp 18 | Ht 71.0 in | Wt 146.3 lb

## 2016-04-21 DIAGNOSIS — R1012 Left upper quadrant pain: Secondary | ICD-10-CM

## 2016-04-21 DIAGNOSIS — D692 Other nonthrombocytopenic purpura: Secondary | ICD-10-CM | POA: Diagnosis not present

## 2016-04-21 DIAGNOSIS — K5909 Other constipation: Secondary | ICD-10-CM

## 2016-04-21 LAB — CBC WITH DIFFERENTIAL/PLATELET
BASOS ABS: 108 {cells}/uL (ref 0–200)
BASOS PCT: 2 %
EOS PCT: 3 %
Eosinophils Absolute: 162 cells/uL (ref 15–500)
HEMATOCRIT: 40.9 % (ref 35.0–45.0)
HEMOGLOBIN: 13.6 g/dL (ref 11.7–15.5)
LYMPHS ABS: 1944 {cells}/uL (ref 850–3900)
Lymphocytes Relative: 36 %
MCH: 29.1 pg (ref 27.0–33.0)
MCHC: 33.3 g/dL (ref 32.0–36.0)
MCV: 87.6 fL (ref 80.0–100.0)
MONOS PCT: 6 %
MPV: 12.6 fL — AB (ref 7.5–12.5)
Monocytes Absolute: 324 cells/uL (ref 200–950)
NEUTROS ABS: 2862 {cells}/uL (ref 1500–7800)
Neutrophils Relative %: 53 %
Platelets: 145 10*3/uL (ref 140–400)
RBC: 4.67 MIL/uL (ref 3.80–5.10)
RDW: 13.8 % (ref 11.0–15.0)
WBC: 5.4 10*3/uL (ref 3.8–10.8)

## 2016-04-21 MED ORDER — HYOSCYAMINE SULFATE 0.125 MG SL SUBL: 0.2500 mg | SUBLINGUAL_TABLET | Freq: Two times a day (BID) | SUBLINGUAL | Status: AC | PRN

## 2016-04-21 NOTE — Progress Notes (Signed)
Presenting complaint;  Left-sided abdominal pain and painful defecation. Nausea and weight loss.  History of present illness.:  Patient is 35 year old Caucasian female patient of Dr. Selinda FlavinKevin Howard who is here for scheduled visit. She was last seen on 10/05/2012 for left-sided abdominal pain. She underwent abdominopelvic CT which is unremarkable. She was felt to have IBS. She subsequently saw Dr. Teena DunkBenson and underwent multiple studies including EGD and gastric emptying study. She states she was begun on gluten-free diet even though biopsies were negative. She did not feel any better and stopped this died after 6 months. She complains of lower abdominal pain which she describes as if she has rawness inside. This pain started over one year ago. Initially it was intermittent but now it is experience every day. She also complains of pain in left upper quadrant of her abdomen which occurs with defecation. She describes this pain as horrible cramping and she has to push this area with a fist which provides some relief. Her bowels are irregular. All she passes his bits and pieces and mucus. She can go as long as one week without bowel movement. She is using polyethylene glycol on when necessary basis. She recalls she had barium enema 7 years ago when she was impacted. She has noted blood on tissue only when she is constipated and has to strain. She denies frank rectal bleeding. She also complains of nausea. She states her nausea started about 3 years ago along with vomiting and headache and associated with weight loss. She says she lost 30 pounds. She was diagnosed with Debroah LoopArnold Chiari syndrome and begun on Diamox and Topamax with the resolution of vomiting but not nausea. Her appetite is poor but she craves for sweets. She denies heartburn. She occasionally will strangle on liquids. She also complains of low-grade fever and intermittent painful swelling to her knees and ankles. She also complains of intermittent  leg rash. Rash now involves both her legs. She also complains of hiccups. She is seeing multiple specialists at two tertiary centers. She is under care of [facility] Fanny Danceaniel Tor[facility] at  [facility] for Chiari malformation. He is seeing Dr. Ancil LinseyBravo of rheumatology service and Dr. Rickey BarbaraBrandon Williams of pain clinic both at [facility]. She also seeing Dr. Inis Sizeronna Culton, dermatologist at [facility]. She states some of for autoimmune antibodies are positive but she has not been diagnosed with autoimmune disorder recently. She was diagnosed with juvenile rheumatoid arthritis at age 89 and was treated until she was 35 years old([facility]) and she has remained in remission since then.    Current Medications: Outpatient Encounter Prescriptions as of 04/21/2016  Medication Sig  . acetaZOLAMIDE (DIAMOX) 250 MG tablet Take 250 mg by mouth. Taking two Tablets in Am and 2 Tablets at Night  . fentaNYL (DURAGESIC - DOSED MCG/HR) 100 MCG/HR Place 50 mcg onto the skin every 3 (three) days.   . Levonorgestrel (SKYLA) 13.5 MG IUD by Intrauterine route.  Marland Kitchen. LYRICA 50 MG capsule Take 50 mg by mouth 3 (three) times daily.   . methylphenidate (RITALIN) 10 MG tablet Take 1 tablet (10 mg total) by mouth 2 (two) times daily with breakfast and lunch.  . metoprolol tartrate (LOPRESSOR) 25 MG tablet TAKE ONE & ONE-HALF TABLETS BY MOUTH TWICE DAILY  . oxyCODONE-acetaminophen (PERCOCET/ROXICET) 5-325 MG per tablet Take 2 tablets by mouth daily as needed for severe pain.   Marland Kitchen. tiZANidine (ZANAFLEX) 2 MG tablet Take 2 mg by mouth as needed.  . topiramate (TOPAMAX) 100 MG  tablet Take 100 mg by mouth. Taking 1 Tablet in Am and 2 tablets in PM  . polyethylene glycol (MIRALAX / GLYCOLAX) packet Take 17 g by mouth daily.  . [DISCONTINUED] amitriptyline (ELAVIL) 25 MG tablet Take one tablet at bedtime for 2 weeks, then 2 tablets at bedtime (Patient not taking: Reported on 04/21/2016)  . [DISCONTINUED] gabapentin (NEURONTIN) 300 MG capsule  Take by mouth. Taking 3 Tablet At Bedtime   No facility-administered encounter medications on file as of 04/21/2016.    Past medical history: History of juvenile rheumatoid arthritis diagnosed at age 69 and she has remained in remission since age 12. Fibromyalgia. She states she was diagnosed at age 77. He has chronic neck as well as low back pain. Chiari malformation. History of depression. She has IUD for contraception.   Allergies: Allergies  Allergen Reactions  . Cephalosporins     REACTION: rash    Family history: Father is 77 year old. Has CAD and diabetes mellitus. Mother is 52 and has fatty liver diabetes mellitus and GERD. She has one sister age 680 with fibromyalgia. She has a brother age 680 who has scoliosis and sarcoidosis.   Social history: She has been married for 9 years. They decided not to have children because of her chronic illness. 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 She smoked cigarettes for about 10 years but a pack a day but quit at age 55. She does not drink alcohol.   Physical examination: Blood pressure 92/64, pulse 60, temperature 98.3 F (36.8 C), temperature source Oral, resp. rate 18, height 5\' 11"  (1.803 m), weight 146 lb 4.8 oz (66.4 kg). Patient is well-developed thin Caucasian female was in no acute distress. Conjunctiva is pink. Sclera is nonicteric Oropharyngeal mucosa is normal. No neck masses or thyromegaly noted. She has multiple tattoos over upper and lower back. She also has macular rash over lower back(due to frequent use of feeding pad) Cardiac exam with regular rhythm normal S1 and S2. No murmur or gallop noted. Lungs are clear to auscultation. Abdomen is flat. Bowel sounds are normal. On palpation abdomen is soft with mild tenderness at LLQ on deep palpation. No organomegaly or masses. No LE edema or clubbing noted. Purpuric rash noted to both legs.   Assessment:  #1.  Left-sided abdominal pain combined with painful defecation most likely due to IBS. Given her history of autoimmune disease  need to make sure she does not have bowel involvement. He has not been diagnosed with active autoimmune disorder. She has purpuric rash involving both legs but she does not have GI symptoms to suggest Henoch-Schonlein purpura. #2. Weight loss secondary to chronic illness. There is nothing suggest malabsorptive syndrome.   Recommendations:  Levsin sublingual 1-2 tablets before defecation. CBC with differential sedimentation rate and CRP. Will request records from Dr. Starr Lake office for further recommendations made. Patient will try to make an appointment with Dr. Orma Flaming so that she could be examined while she has a rash. Patient will return for office visit in 3 months.

## 2016-04-21 NOTE — Patient Instructions (Signed)
Take MiraLAX or polyethylene glycol daily but can titrate dose. Increase intake of fiber rich foods. Will request records from Dr. Starr Lake office. Physician will call with results of blood tests.

## 2016-04-22 LAB — SEDIMENTATION RATE: SED RATE: 1 mm/h (ref 0–20)

## 2016-04-22 LAB — C-REACTIVE PROTEIN

## 2016-04-28 ENCOUNTER — Telehealth (INDEPENDENT_AMBULATORY_CARE_PROVIDER_SITE_OTHER): Payer: Self-pay | Admitting: *Deleted

## 2016-04-28 NOTE — Telephone Encounter (Signed)
Per Dr.Rehman may call in Bentyl 10 mg - take 1 by mouth AC breakfast and lunch. #60 3 refills This was called to the [facility]. The pharmacy will make the patient aware.

## 2016-05-19 ENCOUNTER — Telehealth (INDEPENDENT_AMBULATORY_CARE_PROVIDER_SITE_OTHER): Payer: Self-pay | Admitting: Internal Medicine

## 2016-05-19 NOTE — Telephone Encounter (Signed)
Patient called, stated that Dr. Karilyn Cota had put her on a medication that was too expensive, so he switched her to Bentyl.  She stated that the Bentyl is causing her to be constipated.  She stopped taking the Bentyl a few days ago.  She wants to see if there is something else he can change it to.  Also, she stated that Dr. Karilyn Cota told her he was getting her notes from Dr. Starr Lake office and was going to review them to see if he was going to schedule her for a procedure.  She wanted to check on that as well.  [phone]

## 2016-05-21 NOTE — Telephone Encounter (Signed)
Per Dr.Rehman the patient can take 1 bentyl a day. We had sent for these records from Dr.Benson, but as to date we have not rec'd them. We have [facility] out to [facility] them. Patient was called ,voicemail came on but unable to leave a message due to mail box being full.

## 2016-05-26 ENCOUNTER — Telehealth (INDEPENDENT_AMBULATORY_CARE_PROVIDER_SITE_OTHER): Payer: Self-pay | Admitting: Internal Medicine

## 2016-05-26 NOTE — Telephone Encounter (Signed)
Patient called, stated that someone had tried to call her last week.  I gave her the info in the telephone message.  She stated that has already only been taking 1 Bentyl a day.  Stated that it causes her to be constipated.  [phone]

## 2016-05-27 NOTE — Telephone Encounter (Signed)
Dr.Rehman is going to review the information rec'd from [facility].

## 2016-06-08 ENCOUNTER — Encounter (INDEPENDENT_AMBULATORY_CARE_PROVIDER_SITE_OTHER): Payer: Self-pay

## 2016-06-25 ENCOUNTER — Ambulatory Visit (INDEPENDENT_AMBULATORY_CARE_PROVIDER_SITE_OTHER): Payer: BLUE CROSS/BLUE SHIELD | Admitting: Psychiatry

## 2016-06-25 ENCOUNTER — Encounter (HOSPITAL_COMMUNITY): Payer: Self-pay | Admitting: Psychiatry

## 2016-06-25 VITALS — BP 113/59 | HR 54 | Ht 71.0 in | Wt 141.2 lb

## 2016-06-25 DIAGNOSIS — Z813 Family history of other psychoactive substance abuse and dependence: Secondary | ICD-10-CM | POA: Diagnosis not present

## 2016-06-25 DIAGNOSIS — Z823 Family history of stroke: Secondary | ICD-10-CM

## 2016-06-25 DIAGNOSIS — F331 Major depressive disorder, recurrent, moderate: Secondary | ICD-10-CM

## 2016-06-25 DIAGNOSIS — Z87891 Personal history of nicotine dependence: Secondary | ICD-10-CM

## 2016-06-25 DIAGNOSIS — Z818 Family history of other mental and behavioral disorders: Secondary | ICD-10-CM | POA: Diagnosis not present

## 2016-06-25 DIAGNOSIS — Z833 Family history of diabetes mellitus: Secondary | ICD-10-CM

## 2016-06-25 DIAGNOSIS — Z811 Family history of alcohol abuse and dependence: Secondary | ICD-10-CM

## 2016-06-25 MED ORDER — FLUOXETINE HCL 20 MG PO CAPS: 20.0000 mg | ORAL_CAPSULE | Freq: Every day | ORAL | 2 refills | Status: DC

## 2016-06-25 MED ORDER — METHYLPHENIDATE HCL 10 MG PO TABS: 10.0000 mg | ORAL_TABLET | Freq: Two times a day (BID) | ORAL | 0 refills | Status: DC
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
## 2016-07-22 ENCOUNTER — Encounter: Payer: Self-pay | Admitting: Neurology

## 2016-07-22 ENCOUNTER — Ambulatory Visit (INDEPENDENT_AMBULATORY_CARE_PROVIDER_SITE_OTHER): Payer: BLUE CROSS/BLUE SHIELD | Admitting: Neurology

## 2016-07-22 VITALS — BP 98/53 | HR 55 | Ht 71.0 in | Wt 147.4 lb

## 2016-07-22 DIAGNOSIS — R51 Headache with orthostatic component, not elsewhere classified: Secondary | ICD-10-CM

## 2016-07-22 DIAGNOSIS — H53131 Sudden visual loss, right eye: Secondary | ICD-10-CM

## 2016-07-22 DIAGNOSIS — R202 Paresthesia of skin: Secondary | ICD-10-CM

## 2016-07-22 DIAGNOSIS — R2990 Unspecified symptoms and signs involving the nervous system: Secondary | ICD-10-CM

## 2016-07-22 DIAGNOSIS — G932 Benign intracranial hypertension: Secondary | ICD-10-CM | POA: Diagnosis not present

## 2016-07-22 DIAGNOSIS — R251 Tremor, unspecified: Secondary | ICD-10-CM

## 2016-07-22 DIAGNOSIS — R519 Headache, unspecified: Secondary | ICD-10-CM

## 2016-07-22 MED ORDER — TOPIRAMATE 100 MG PO TABS: 200.0000 mg | ORAL_TABLET | Freq: Every day | ORAL | 6 refills | Status: AC

## 2016-07-22 NOTE — Progress Notes (Signed)
[facility]   Provider:  Dr Jaynee Eagles Referring Provider: Monico Blitz, MD Primary Care Physician:  Rory Percy, MD  CC:  Multiple unexplained neurologic symptoms  Interval history 07/22/2016: Patient does NOT have Multiple Sclerosis. MRis of the brain have not shown any lesions suspicious for MS.  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 [place] is having a lot of new problems recent;y, problems with her eyes. She can;t think to talk, she describes memory issues. She can't do her bills anymore. She went to the eye doctor about her eyes. The exam was normal. She keeps saying she has MS, I reassured her that her brain showed no lesions and she does not have MS. She feels like her eye balls are going to fall out of the sockets. She has pain behind the right eye, a white film behind the eye, worsening with vision changes. It hurts to move her eyes but worse in the right eye. She gets hot and daily she gets hot. If she is in the shower and it gets too hot she has pins and needles tingling all over. She had to use a shower chair. They have "checked me for everything".  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 Headaches are worsening and eye pain is worsening. Headaches are pressure in the temple area and always blurry. Will stp the diamox and increase topamax to '200mg'$  qhs. Lots of muscule weakness. In the legs and amrs. Legs feel like they are numb.   HPI:  Anita CREEKis a 35y.o. female here as a referral from Dr. SManuella Ghazifor myalgias and paresthesias. She has a PMHx of, fibromyalgia, "water on the brain" and is on diamox and topiramate from a previous neurologist. She has vision loss and blurry vision. She has multiple symptoms. From the age of 89to 89her RA worsened  with swelling in the joints. 3 years ago she passed out on the kitchen floor and fluid started pouring out of her nose. She was having nausea and vomiting daily which lasted for 6 month to a year and she lost 50 pounds in 3 months. 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 [place] she was told she had "fluid on her brain" and was started on Diamox and Topamax for headaches. She is still on Diamox and Topamax. She never saw neurology again. She was evaluated by neurosurgery. She has been to "every doctor under the sun" and she has been told that she has MS or she has Lupus. She would like to be evaluated for MS. Her current symptoms are eye pain, eye blurriness, eye cloudiness. The medication she is on currently help her headaches. A few times a week she has pounding in the forehead to the temple areas, no light sensitivity, no sound sensitivity, she has "every problem with her ears she can have", she hears swishing in the ears and now wears ear plugs constantly. She has a lot of weakness and sometimes to hold her phone tires her out and she has to look at her phone while on her lap cause she cant lift it. It is fatigue that is symmetric and in all the extremities, no proximal or distal  feautures just weakness all over. She describes double vision, she has episodes of loss of vision for 5 seconds a few times a week. She has not been to the opthalmologist for evaluation. She has tremors inside deep inside and is shaking which is painful. She has numbness and tingling in the feet and toes and sometimes her hands will fall asleep when she is asleep but this is occassional. She has a lot of neck pain and low back pain. She has urinary retention, she can't urinate. She has episodes of vision blurriness. She had an episode of shaking all over and it looke dlike she was dancing but she remembers it all, she was  shaking uncontrollable, it lasted for 20 -30 minutes, she was completely aware and husband says it looked like she was "dancing". She has been extensively evaluated and thyroid has been normal. She had an episode of the worst pain of her life inher legs that lasted 2 hours.  Reviewed notes, labs and imaging from outside physicians, which showed:  anca neg Ck 81 Neg hep B and Csed 20 nml tsh hiv neg +ANA 1:80 speckled Normal: aldolase, alt, anca, anti-ccp, dsdna, ast, c3,c4,crp, ena, rf,ck(66),cryoglobulin,  EXAM: personally reviewed images LUMBAR SPINE - COMPLETE 4+ VIEW  COMPARISON: Sagittal and coronal reformatted images CT abdomen pelvis 12/01/2012.  FINDINGS: Alignment is anatomic. Vertebral body and disc space heights are maintained. No definite degenerative changes. No pars defects.  CMP unremarkable    Review of Systems: Patient complains of symptoms per HPI as well as the following symptoms: no CP, no SOB. Pertinent negatives per HPI. All others negative.    Social History   Social History  . Marital status: Married    Spouse name: [name]  . Number of children: 0  . Years of education: 16+   Occupational History  . Not on file.   Social History Main Topics  . Smoking status: Former Smoker    Packs/day: 2.00    Years: 11.00    Types: Cigarettes    Quit date: 04/21/2008  . Smokeless tobacco: Never Used     Comment: quit  4 yrs ago after smoking 10 yr. 1 pack a day  . Alcohol use No     Comment: No etoh in 2-3 yrs.  Weekend she use to drink beer and liquor  . Drug use: No  . Sexual activity: Yes   Other Topics Concern  . Not on file   Social History Narrative   Lives at home with husband.   Caffeine use: Drinks coffee   Rarely drinks diet soda (1 can per day)    Family History  Problem Relation Age of Onset  . Depression Mother   . Drug abuse Mother   . Alcohol abuse Mother   . Diabetes Mother   . Heart disease Mother   . Hiatal  hernia Mother   . Depression Father   . Diabetes Father   . Heart disease Father   . Depression Sister   . Fibromyalgia Sister   . Depression Brother   . Drug abuse Brother   . Alcohol abuse Brother   . Stroke Other   . Hypertension Other   . Hyperlipidemia Other     Past Medical History:  Diagnosis Date  . Arnold-Chiari malformation ([place])   . AS (ankylosing spondylitis) ([facility])   . Depression   . Fatigue   . Fibromyalgia   . Headache   . Hiatal hernia   . 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 Lupus (systemic lupus erythematosus) ([place])   . Palpitations   . Pericardial effusion   . Shortness of breath   . Ventricular tachycardia ([place])    nonsutained by cardiac monitor November 21, 2008 no other arrhythmias and negative workup for structural heart diseas e(normal echo)    Past Surgical History:  Procedure Laterality Date  . NO PAST SURGERIES    . UPPER GASTROINTESTINAL ENDOSCOPY  2014   Dr.Benson @ [facility]    Current Outpatient Prescriptions  Medication Sig Dispense Refill  . LYRICA 50 MG capsule Take 50 mg by mouth 3 (three) times daily.   0  . meloxicam (MOBIC) 7.5 MG tablet Take 7.5 mg by mouth daily.    . methylphenidate (RITALIN) 10 MG tablet Take 1 tablet (10 mg total) by mouth 2 (two) times daily with breakfast and lunch. 60 tablet 0  . metoprolol tartrate (LOPRESSOR) 25 MG tablet TAKE ONE & ONE-HALF TABLETS BY MOUTH TWICE DAILY 90 tablet 0  . Plecanatide (TRULANCE) 3 MG TABS Take 1 tablet by mouth daily.    . polyethylene glycol (MIRALAX / GLYCOLAX) packet Take 17 g by mouth daily.    Marland Kitchen topiramate (TOPAMAX) 100 MG tablet Take 2 tablets (200 mg total) by mouth at bedtime. 60 tablet 6   Current Facility-Administered Medications  Medication Dose Route Frequency Provider Last Rate Last Dose  . hyoscyamine (LEVSIN SL) SL tablet 0.25 mg  0.25 mg Sublingual BID PRN Rogene Houston, MD        Allergies as of 07/22/2016 - Review Complete 07/22/2016  Allergen Reaction  Noted  . Cephalosporins      Vitals: BP (!) 98/53 (BP Location: Right Arm, Patient Position: Sitting, Cuff Size: Normal)   Pulse (!) 55   Ht '5\' 11"'$  (1.803 m)   Wt 147 lb 6.4 oz (66.9 kg)   BMI 20.56 kg/m  Last Weight:  Wt Readings from Last 1 Encounters:  07/22/16 147 lb 6.4 oz (66.9 kg)   Last Height:   Ht Readings from Last 1 Encounters:  07/22/16 '5\' 11"'$  (1.803 m)       Physical exam: Exam: Gen: NAD, conversant, well nourised, thin, well groomed                     CV: RRR, no MRG. No Carotid Bruits. No peripheral edema, warm, nontender Eyes: Conjunctivae clear without exudates or hemorrhage  Neuro: Detailed Neurologic Exam  Speech:    Speech is normal; fluent and spontaneous with normal comprehension.  Cognition:    The patient is oriented to person, place, and time;     recent and remote memory intact;     language fluent;     normal attention, concentration,     fund of knowledge Cranial Nerves:    The pupils are equal, round, and reactive to light. The fundi are normal and spontaneous venous pulsations are present. Visual fields are full to finger confrontation. Extraocular movements are intact. Trigeminal sensation is intact and the muscles of mastication are normal. The face is symmetric. The palate elevates in the midline. Hearing intact. Voice is normal. Shoulder shrug is normal. The tongue has normal motion without fasciculations.   Coordination:    Normal finger to nose and heel to shin.   Gait:    No ataxia  Motor Observation: mild postural tremor     Tone:    Normal muscle tone.    Posture:    Posture is normal. normal  erect    Strength: Poor effort.  Strength is 4/V in the upper and lower limbs.      Sensation: intact to LT     Reflex Exam:  DTR's:    Deep tendon reflexes in the upper and lower extremities are brisk bilaterally.   Toes:    The toes are downgoing bilaterally.   Clonus:    Clonus is absent.   Assessment/Plan:   35 year old female here as a referral from Dr. Manuella Ghazi for MS evaluation. Patient does not have Multiple Sclerosis. She has a PMHx of JRA, fibromyalgia, . She has been evaluated by multiple neurologists (see addendum below),Her neurologic exam is unremarkable, non focal. She has multiple complaints including tremors, vision changes, migraines, hearing loss, dysarthria, weakness, paresthesias, diplopia, urinary retention, chronic neck pain, fevers, chills, malaise, fatigue, rash, arm and leg pain and other complaints without etiology.   - MRis of the brain have not shown any lesions suspicious for MS.  Patient has significant cerebellar ectopia of 13-38m but this cannot explain all her symptoms and she has been evaluated by [facility] and surgical intervention not recommended - Patient should be evaluated for psychogenic etiologies of her multiple neurologic and somatic symptoms; multiple extensive workup have found no etiology for most of her symptoms. - Will order an MRi of the brain and orbits, evoked potentials due to vision complaints and previous IIH to evaluate for increased intracranial hypertension, optic neuritis or other causes of her vision problems and eye pain. Can consider LP afterwards if needed. - I recommend staying on either topamax or diamox not both. Do not get pregnant, use back up as can interfere with birth control - Recent emg/ncs of right arm and right leg normal - will order several more labs today. Her B12 is low and her TSH is elevelated. 1003m B12 daily po long term. Discussed B12 deficiency can cause weakness, fatigue, easy bruising or bleeding,sore tongue, stomach upset, weight loss, and diarrhea or constipation, tingling or numbness to the fingers and toes, difficulty walking, mood changes, depression, memory loss, disorientation and, in severe cases, dementia. Recommend 1000-2000 mcg B12 daily. recheck B12 in 3 months. Follow up with pcp for her tsh.  - Cc: alfredo C Myna HidalgoMD  Requested records from:  Dr. EdLorelee MarketHiDaneil DolinMD  Neurologist  [id number], [place] [id number] [phone] Addendum: MRI of the brain cervical and thoracic spines performed on 08/14/2015 showed that the cerebellar tonsils project 13-14 mm below the foramen magnum and are pointed with CSF space effacement at the foramen magnum and mild mass effect on the medulla. Otherwise unremarkable appearance of the brain. Normal appearance of the cervical spinal cord. Mild lower cervical spondylosis without stenosis. Broad left paracentral disc protrusion without significant stenosis at C5-C6. Broad central disc protrusion without stenosis at C6-C7 otherwise negative. Normal appearance of the thoracic spinal cord. No disc herniation or stenosis. [facility] hemoglobin A1c of 5.2. TSH 2.3, B12 1161, folate greater than 20, Lyme negative, RPR nonreactive, vitamin D normal protein electrophoresis and immunofixation normal CK 55, B1 normal, B6 30.2 which is elevated above the reference range of 21.7 upper limit normal, CSF showed normal cell count and differential, 63 glucose, 43 protein, and no malignant cells or significant inflammation is identified CSF IgG 2.8 which is within the normal limits of 0.8-7.7. MRI of the brain completed on 05/29/2013 showed that the cerebellar tonsils extend 7.2 mm  below the foramen magnum down to the level of the C1 arch with crowding of the foramen magnum. EMG/nerve conductions  of the upper extremities were normal. Nerve conductions of the lower extremities shows normal left tibial motor, decreased amplitude of the left peroneal motor at 0.8 V, normal right tibial motor, normal right peroneal motor, normal bilateral sural sensory, left H wave 31.1 ms, right H wave 33.2 ms, normal bilateral F-wave latencies of the tibial and peroneal nerves. MRI of the brain on December 2014 again showed cerebellar tonsillar ectopia  measuring approximately 7 mm, unchanged. No hydrocephalus concerning enhancement. Arnold Chiari I malformation. MRI of the cervical spine on 09/14/2013 showed that the cerebellar tonsils are 8 mm below the foramen magnum. No mass effect on the craniocervical junction and there is no syrinx. Otherwise normal. Routine EEG was normal. Note states she was there for multiple neurologic complaints including numbness and tingling in all the extremities, weakness, muscle jerks, fatigue, heavy feeling throughout her body, memory difficulty, word finding difficulty, urinary and bowel hesitancy, nausea, balance difficulty, weight loss, double vision, blurred vision, vision loss with left eye pain, headache and pain throughout her body. Patient was diagnosed with IBS and chronic nausea from a GI specialist. Weight loss was diagnosed as secondary to gluten intolerance. ANA and ESR were normal. The patient was started on Topamax for her headaches. She was treated for her low back pain. Opening pressure was 24 on lumbar puncture. She was on Topamax 100 mg twice a day, Diamox 250 mg 1-2 tablet at bedtime, tizanidine, imipramine, metoprolol, fluticasone, ibuprofen, lens asked. Lumbar puncture were negative for demyelinating disease.  Sarina Ill, MD  [facility] [address] [place], [place] [address]  Phone [phone] Fax [phone]   A total of 40 minutes was spent face-to-face with this patient. Over half this time was spent on counseling patient on the multiple neurologic symptoms diagnosis and different diagnostic and therapeutic options available.

## 2016-07-22 NOTE — Patient Instructions (Addendum)
Remember to drink plenty of fluid, eat healthy meals and do not skip any meals. Try to eat protein with a every meal and eat a healthy snack such as fruit or nuts in between meals. Try to keep a regular sleep-wake schedule and try to exercise daily, particularly in the form of walking, 20-30 minutes a day, if you can.   As far as your medications are concerned, I would like to suggest: Stop Diamox. Continue Topiramate 200mg  at night  As far as diagnostic testing: Labs, MRI brain and orbits, visual evoked potentials, emg/ncs  I would like to see you back for emg/ncs, sooner if we need to. Please call us with any interim questions, concerns, problems, updates or refill requests.   Our phone number is [phone]. We also have an after hours call service for urgent matters and there is a physician on-call for urgent questions. For any emergencies you know to call 911 or go to the nearest emergency room

## 2016-07-23 ENCOUNTER — Ambulatory Visit (INDEPENDENT_AMBULATORY_CARE_PROVIDER_SITE_OTHER): Payer: BLUE CROSS/BLUE SHIELD | Admitting: Neurology

## 2016-07-23 ENCOUNTER — Ambulatory Visit (INDEPENDENT_AMBULATORY_CARE_PROVIDER_SITE_OTHER): Payer: Self-pay | Admitting: Neurology

## 2016-07-23 ENCOUNTER — Telehealth: Payer: Self-pay | Admitting: *Deleted

## 2016-07-23 DIAGNOSIS — Z0289 Encounter for other administrative examinations: Secondary | ICD-10-CM

## 2016-07-23 DIAGNOSIS — R531 Weakness: Secondary | ICD-10-CM

## 2016-07-23 DIAGNOSIS — R202 Paresthesia of skin: Secondary | ICD-10-CM

## 2016-07-23 DIAGNOSIS — E538 Deficiency of other specified B group vitamins: Secondary | ICD-10-CM

## 2016-07-23 LAB — BASIC METABOLIC PANEL
BUN/Creatinine Ratio: 22 (ref 9–23)
BUN: 16 mg/dL (ref 6–20)
CHLORIDE: 109 mmol/L — AB (ref 96–106)
CO2: 17 mmol/L — AB (ref 18–29)
CREATININE: 0.73 mg/dL (ref 0.57–1.00)
Calcium: 9.4 mg/dL (ref 8.7–10.2)
GFR calc Af Amer: 124 mL/min/{1.73_m2} (ref >59)
GFR calc non Af Amer: 108 mL/min/{1.73_m2} (ref >59)
GLUCOSE: 86 mg/dL (ref 65–99)
Potassium: 4.7 mmol/L (ref 3.5–5.2)
Sodium: 146 mmol/L — ABNORMAL HIGH (ref 134–144)

## 2016-07-23 LAB — B12 AND FOLATE PANEL
Folate: >20 ng/mL (ref >3.0)
Vitamin B-12: 213 pg/mL (ref 211–946)

## 2016-07-23 LAB — HIV ANTIBODY (ROUTINE TESTING W REFLEX): HIV Screen 4th Generation wRfx: NONREACTIVE

## 2016-07-23 LAB — RPR: RPR: NONREACTIVE

## 2016-07-23 LAB — TSH: TSH: 5.21 u[IU]/mL — ABNORMAL HIGH (ref 0.450–4.500)

## 2016-07-23 NOTE — Telephone Encounter (Signed)
-----   Message from Anson Fret, MD sent at 07/23/2016  4:20 PM EST ----- Vitamin B12 was low (already discussed with ehr at emg). Also her thyroid value was abnormal, she should make sure with her primary care that she is not hypothyroid thanks

## 2016-07-23 NOTE — Progress Notes (Signed)
Provider: Dr Naomie DeanAntonia Adit George Referring Provider: Annita Brodivadeneira, Alfredo MD   History: Anita GreenBecky S Armstrongis a 35 y.o.femalehere as a referral for myalgias and paresthesias, weakness.    Summary  Nerve conduction studies were performed on the right upper and right lower extremities:  Right APB Median motor nerve showed normal conductions. Right Ulnar ADM Ulnar motor nerves showed normal conductions with normal F Wave latency Right Peroneal motor nerve showed normal conductions. Right Tibial motor nerve showed normal conductions with normal F Wave latency Right second-digit Median sensory nerve conduction was within normal limits Right fifth-digit Ulnar sensory nerve conduction was within normal limits Rght Sural sensory nerve conduction was within normal limits Rght Superficial Peroneal sensory nerve conduction was within normal limits Rght Radial sensory nerve conduction was within normal limits  EMG Needle study was performed on selected right upper and lower extremity muscles:   The Deltoid, Biceps, Triceps, Pronator Teres, Opponens Pollicis, Flexor Digitorum Profundus, iliopsoas,  Vastus Medialis, Anterior Tibialis, Medial Gastrocnemius, Extensor Hallucis Longus and Abductor Hallucis muscles were within normal limits.  Conclusion: This is a normal study. No electrophysiologic evidence for peripheral polyneuropathy, mononeuropathy, radiculopathy, or neuromuscular disorder.  Naomie DeanAntonia Teva Bronkema, MD  Cc: Dr. Danella Sensingivadeneira   [facility] [address] [place], [place] [address] Tel: [phone] Fax: [phone]  [facility]    Nerve / Sites Rec. Site Peak Lat Ref. Amp.1-2 Ref. Distance    ms ms V V cm  R Radial - Anatomical snuff box (Forearm)     Forearm Wrist 2.40 ?2.90 23.5 ?15.0 10  R Sural - Ankle (Calf)     Calf Ankle 4.32 ?4.40 22.2 ?6.0 14  R Superficial peroneal - Ankle     Lat leg Ankle 4.11 ?4.40 10.8 ?6.0 14  R Median - Orthodromic (Dig II, Mid palm)     Dig  II Wrist 2.76 ?3.40 61.9 ?10.0 13  R Ulnar - Orthodromic, (Dig V, Mid palm)     Dig V Wrist 2.60 ?3.10 18.2 ?5.0 11     [facility]    Nerve / Sites Muscle Latency Ref. Amplitude Ref. Rel Amp Segments Distance Lat Diff Velocity Ref. Area    ms ms mV mV %  cm ms m/s m/s mVms  R Median - APB     Wrist APB 3.2 ?4.4 7.9 ?4.0 100 Wrist - APB 7    29.5     Upper arm APB 6.9  6.6  82.8 Upper arm - Wrist 22 3.7 59  24.7  R Ulnar - ADM     Wrist ADM 2.8 ?3.3 9.7 ?6.0 100 Wrist - ADM 7    30.0     B.Elbow ADM 5.5  9.4  96.6 B.Elbow - Wrist 16 2.8 58 ?49 28.9     A.Elbow ADM 7.4  9.9  106 A.Elbow - B.Elbow 10 1.9 53 ?49 30.6         A.Elbow - Wrist  4.6     R Peroneal - EDB     Ankle EDB 5.2 ?6.5 2.4 ?2.0 100 Ankle - EDB 9    9.9     Fib head EDB 12.6  1.9  79.8 Fib head - Ankle 37 7.3 50 ?44 9.1     Pop fossa EDB 14.4  2.0  106 Pop fossa - Fib head 10 1.8 55 ?44 9.3         Pop fossa - Ankle  9.2     R Tibial - AH     Ankle AH 4.1 ?  5.8 12.1 ?4.0 100 Ankle - AH 9    32.8     Pop fossa AH 13.8  10.0  82.8 Pop fossa - Ankle 40 9.7 41 ?41 28.4     F  Wave    Nerve F Lat Ref.   ms ms  R Tibial - AH 55.5 ?56.0  R Ulnar - ADM 24.8 ?32.0

## 2016-07-23 NOTE — Patient Instructions (Signed)
Remember to drink plenty of fluid, eat healthy meals and do not skip any meals. Try to eat protein with a every meal and eat a healthy snack such as fruit or nuts in between meals. Try to keep a regular sleep-wake schedule and try to exercise daily, particularly in the form of walking, 20-30 minutes a day, if you can.   As far as your medications are concerned, I would like to suggest: b12 is very low. 1000-2000 mcg B12 daily po long term. Needs recheck in 3 months. B12 deficiency can cause weakness, fatigue, easy bruising or bleeding,sore tongue, stomach upset, weight loss, and diarrhea or constipation, tingling or numbness to the fingers and toes, difficulty walking, mood changes, depression, memory loss, disorientation and, in severe cases, dementia.    As far as diagnostic testing: lab today  I would like to see you back in 3 months, sooner if we need to. Please call us with any interim questions, concerns, problems, updates or refill requests.   Our phone number is [phone]. We also have an after hours call service for urgent matters and there is a physician on-call for urgent questions. For any emergencies you know to call 911 or go to the nearest emergency room

## 2016-07-23 NOTE — Procedures (Signed)
Provider: Dr Anita DeanAntonia Syleena George Referring Provider: Annita George, Anita George  CC: Multiple unexplained neurologic symptoms  History: Anita RobinsonBecky S Armstrongis a 35 y.o.femalehere as a referral for myalgias and paresthesias, weakness.    Summary  Nerve conduction studies were performed on the right upper and right lower extremities:  Right APB Median motor nerve showed normal conductions. Right Ulnar ADM Ulnar motor nerves showed normal conductions with normal F Wave latency Right Peroneal motor nerve showed normal conductions. Right Tibial motor nerve showed normal conductions with normal F Wave latency Right second-digit Median sensory nerve conduction was within normal limits Right fifth-digit Ulnar sensory nerve conduction was within normal limits Rght Sural sensory nerve conduction was within normal limits Rght Superficial Peroneal sensory nerve conduction was within normal limits Rght Radial sensory nerve conduction was within normal limits  EMG Needle study was performed on selected right upper and lower extremity muscles:   The Deltoid, Biceps, Triceps, Pronator Teres, Opponens Pollicis, Flexor Digitorum Profundus, iliopsoas,  Vastus Medialis, Anterior Tibialis, Medial Gastrocnemius, Extensor Hallucis Longus and Abductor Hallucis muscles were within normal limits.  Conclusion: This is a normal study. No electrophysiologic evidence for peripheral polyneuropathy, mononeuropathy, radiculopathy, or neuromuscular disorder.   Anita DeanAntonia Moshe Wenger, George  [facility] [address] [place], [place] [address] Tel: [phone] Fax: [phone]  [facility]    Nerve / Sites Rec. Site Peak Lat Ref. Amp.1-2 Ref. Distance    ms ms V V cm  R Radial - Anatomical snuff box (Forearm)     Forearm Wrist 2.40 ?2.90 23.5 ?15.0 10  R Sural - Ankle (Calf)     Calf Ankle 4.32 ?4.40 22.2 ?6.0 14  R Superficial peroneal - Ankle     Lat leg Ankle 4.11 ?4.40 10.8 ?6.0 14  R Median - Orthodromic  (Dig II, Mid palm)     Dig II Wrist 2.76 ?3.40 61.9 ?10.0 13  R Ulnar - Orthodromic, (Dig V, Mid palm)     Dig V Wrist 2.60 ?3.10 18.2 ?5.0 11     [facility]    Nerve / Sites Muscle Latency Ref. Amplitude Ref. Rel Amp Segments Distance Lat Diff Velocity Ref. Area    ms ms mV mV %  cm ms m/s m/s mVms  R Median - APB     Wrist APB 3.2 ?4.4 7.9 ?4.0 100 Wrist - APB 7    29.5     Upper arm APB 6.9  6.6  82.8 Upper arm - Wrist 22 3.7 59  24.7  R Ulnar - ADM     Wrist ADM 2.8 ?3.3 9.7 ?6.0 100 Wrist - ADM 7    30.0     B.Elbow ADM 5.5  9.4  96.6 B.Elbow - Wrist 16 2.8 58 ?49 28.9     A.Elbow ADM 7.4  9.9  106 A.Elbow - B.Elbow 10 1.9 53 ?49 30.6         A.Elbow - Wrist  4.6     R Peroneal - EDB     Ankle EDB 5.2 ?6.5 2.4 ?2.0 100 Ankle - EDB 9    9.9     Fib head EDB 12.6  1.9  79.8 Fib head - Ankle 37 7.3 50 ?44 9.1     Pop fossa EDB 14.4  2.0  106 Pop fossa - Fib head 10 1.8 55 ?44 9.3         Pop fossa - Ankle  9.2     R Tibial - AH     Ankle AH  4.1 ?5.8 12.1 ?4.0 100 Ankle - AH 9    32.8     Pop fossa AH 13.8  10.0  82.8 Pop fossa - Ankle 40 9.7 41 ?41 28.4     F  Wave    Nerve F Lat Ref.   ms ms  R Tibial - AH 55.5 ?56.0  R Ulnar - ADM 24.8 ?32.0

## 2016-07-23 NOTE — Telephone Encounter (Signed)
Called and spoke to pt about lab results per AA,MD. She verbalized understanding. She will f/u with PCP about elevated TSH. Advised I will send copy to PCP for their records. Verified we had correct PCP on file.  Sent lab results via EPIC to PCP, Selinda Flavin, MD.

## 2016-07-23 NOTE — Progress Notes (Signed)
See procedure note.

## 2016-07-27 ENCOUNTER — Encounter (HOSPITAL_COMMUNITY): Payer: Self-pay | Admitting: Psychiatry

## 2016-07-27 ENCOUNTER — Ambulatory Visit (INDEPENDENT_AMBULATORY_CARE_PROVIDER_SITE_OTHER): Payer: BLUE CROSS/BLUE SHIELD | Admitting: Psychiatry

## 2016-07-27 VITALS — BP 104/58 | HR 60 | Resp 16 | Wt 142.4 lb

## 2016-07-27 DIAGNOSIS — Z818 Family history of other mental and behavioral disorders: Secondary | ICD-10-CM

## 2016-07-27 DIAGNOSIS — Z811 Family history of alcohol abuse and dependence: Secondary | ICD-10-CM

## 2016-07-27 DIAGNOSIS — Z79899 Other long term (current) drug therapy: Secondary | ICD-10-CM

## 2016-07-27 DIAGNOSIS — F331 Major depressive disorder, recurrent, moderate: Secondary | ICD-10-CM

## 2016-07-27 DIAGNOSIS — Z833 Family history of diabetes mellitus: Secondary | ICD-10-CM

## 2016-07-27 DIAGNOSIS — Z813 Family history of other psychoactive substance abuse and dependence: Secondary | ICD-10-CM

## 2016-07-27 DIAGNOSIS — Z8249 Family history of ischemic heart disease and other diseases of the circulatory system: Secondary | ICD-10-CM

## 2016-07-27 LAB — METHYLMALONIC ACID, SERUM: Methylmalonic Acid: 235 nmol/L (ref 0–378)

## 2016-07-27 MED ORDER — METHYLPHENIDATE HCL 20 MG PO TABS: 20.0000 mg | ORAL_TABLET | Freq: Two times a day (BID) | ORAL | 0 refills | Status: DC
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
## 2016-07-28 ENCOUNTER — Encounter (INDEPENDENT_AMBULATORY_CARE_PROVIDER_SITE_OTHER): Payer: Self-pay | Admitting: *Deleted

## 2016-07-28 ENCOUNTER — Other Ambulatory Visit (INDEPENDENT_AMBULATORY_CARE_PROVIDER_SITE_OTHER): Payer: Self-pay | Admitting: *Deleted

## 2016-07-28 ENCOUNTER — Encounter (INDEPENDENT_AMBULATORY_CARE_PROVIDER_SITE_OTHER): Payer: Self-pay | Admitting: Internal Medicine

## 2016-07-28 ENCOUNTER — Ambulatory Visit (INDEPENDENT_AMBULATORY_CARE_PROVIDER_SITE_OTHER): Payer: BLUE CROSS/BLUE SHIELD | Admitting: Internal Medicine

## 2016-07-28 VITALS — BP 96/62 | HR 62 | Temp 97.5°F | Ht 71.0 in | Wt 144.0 lb

## 2016-07-28 DIAGNOSIS — K219 Gastro-esophageal reflux disease without esophagitis: Secondary | ICD-10-CM | POA: Diagnosis not present

## 2016-07-28 DIAGNOSIS — R531 Weakness: Secondary | ICD-10-CM

## 2016-07-28 DIAGNOSIS — K581 Irritable bowel syndrome with constipation: Secondary | ICD-10-CM | POA: Diagnosis not present

## 2016-07-28 LAB — CBC
HCT: 41.4 % (ref 35.0–45.0)
Hemoglobin: 13.7 g/dL (ref 11.7–15.5)
MCH: 29.3 pg (ref 27.0–33.0)
MCHC: 33.1 g/dL (ref 32.0–36.0)
MCV: 88.5 fL (ref 80.0–100.0)
MPV: 12.7 fL — ABNORMAL HIGH (ref 7.5–12.5)
PLATELETS: 155 10*3/uL (ref 140–400)
RBC: 4.68 MIL/uL (ref 3.80–5.10)
RDW: 13.3 % (ref 11.0–15.0)
WBC: 5.9 10*3/uL (ref 3.8–10.8)

## 2016-07-28 MED ORDER — PLECANATIDE 3 MG PO TABS: 1.0000 | ORAL_TABLET | Freq: Every day | ORAL | 5 refills | Status: DC

## 2016-07-28 MED ORDER — PANTOPRAZOLE SODIUM 40 MG PO TBEC: 40.0000 mg | DELAYED_RELEASE_TABLET | Freq: Every day | ORAL | 5 refills | Status: DC

## 2016-07-28 NOTE — Patient Instructions (Addendum)
Take pantoprazole by mouth 30 minutes before evening meal. Call if nausea and regurgitation not better within 2 - 3 weeks. Physician will call with results of blood work when completed.

## 2016-07-28 NOTE — Progress Notes (Signed)
Presenting complaint;  Follow-up for constipation and left-sided abdominal pain.  Database and Subjective:  Patient is 35 year old Caucasian female who is here for scheduled visit. She was last seen on 04/21/2016 for left-sided abdominal pain and painful defecation. Records from Dr. Starr Lake office included only an EGD from October 2014 revealing small sliding hiatal hernia. Blood work included a CRP of less than 0.5, sedimentation rate of 1  WBC of 5.4 with H&H of 13.6 and 40.9 and platelet count of 145K. She was begun on dicyclomine. She was begun on plecanatide for constipation. She says she is having better results. She has a bowel movement just about every day but she rarely has sense of bleed evacuation. On some days she is taking MiraLAX as well. She denies melena or rectal bleeding. She states her appetite is normal but she gets filled up quickly and then she feels hungry within 10-15 minutes of her meals. She also complains of frequent regurgitation particularly at night and she also has hot liquid or throat after meals. She does not experience heartburn. She has nausea every morning and it gets better as the day progresses. She states she had IUD removed 5 weeks ago. She is on by mouth hormonal therapy but she has been passing blood every day. She feels weaker than she has been. She wonders if she has become anemic. He continues to complain of pain at left mid abdomen but she says pain is not as bad or as frequent as it used to be. She feels her attention span is not as good as it has been.      Current Medications: Outpatient Encounter Prescriptions as of 07/28/2016  Medication Sig  . buprenorphine (BUTRANS - DOSED MCG/HR) 10 MCG/HR PTWK patch Place onto the skin.  Marland Kitchen ketoconazole (NIZORAL) 2 % shampoo Wash scalp 1-2 times weekly. Let sit for 5 minutes then wash out  . LYRICA 50 MG capsule Take 50 mg by mouth 3 (three) times daily.   . meloxicam (MOBIC) 7.5 MG tablet Take 7.5 mg by  mouth daily.  . metoprolol tartrate (LOPRESSOR) 25 MG tablet Take by mouth.  . Plecanatide (TRULANCE) 3 MG TABS Take 1 tablet by mouth daily.  . polyethylene glycol (MIRALAX / GLYCOLAX) packet Take 17 g by mouth daily.  . pregabalin (LYRICA) 50 MG capsule   . topiramate (TOPAMAX) 100 MG tablet Take 2 tablets (200 mg total) by mouth at bedtime.  . tretinoin (RETIN-A) 0.025 % gel Apply topically.  . triamcinolone cream (KENALOG) 0.1 % Apply topically.  . [DISCONTINUED] methylphenidate (RITALIN) 10 MG tablet   . AVIANE 0.1-20 MG-MCG tablet   . [DISCONTINUED] acetaZOLAMIDE (DIAMOX) 250 MG tablet   . [DISCONTINUED] fentaNYL (DURAGESIC - DOSED MCG/HR) 12 MCG/HR Place onto the skin.  . [DISCONTINUED] fentaNYL (DURAGESIC - DOSED MCG/HR) 25 MCG/HR patch Place onto the skin.  . [DISCONTINUED] fentaNYL (DURAGESIC - DOSED MCG/HR) 50 MCG/HR Place onto the skin.  . [DISCONTINUED] fluticasone (FLONASE) 50 MCG/ACT nasal spray Place into the nose.  . [DISCONTINUED] imipramine (TOFRANIL) 10 MG tablet 10 mg.  . [DISCONTINUED] linaclotide (LINZESS) 290 MCG CAPS capsule Take by mouth.  . [DISCONTINUED] methylphenidate (RITALIN) 20 MG tablet Take 1 tablet (20 mg total) by mouth 2 (two) times daily with breakfast and lunch.  . [DISCONTINUED] methylphenidate (RITALIN) 20 MG tablet Take 1 tablet (20 mg total) by mouth 2 (two) times daily with breakfast and lunch.  . [DISCONTINUED] metoprolol tartrate (LOPRESSOR) 25 MG tablet TAKE ONE & ONE-HALF TABLETS BY  MOUTH TWICE DAILY  . [DISCONTINUED] metroNIDAZOLE (METROGEL) 0.75 % vaginal gel   . [DISCONTINUED] oxybutynin (DITROPAN-XL) 10 MG 24 hr tablet   . [DISCONTINUED] oxyCODONE-acetaminophen (PERCOCET/ROXICET) 5-325 MG tablet Take by mouth.  . [DISCONTINUED] polyethylene glycol (MIRALAX / GLYCOLAX) packet Take by mouth.  . [DISCONTINUED] ranitidine (ZANTAC) 150 MG tablet Take by mouth.  . [DISCONTINUED] tiZANidine (ZANAFLEX) 2 MG tablet Take 2 mg by mouth.  .  [DISCONTINUED] tizanidine (ZANAFLEX) 6 MG capsule   . [DISCONTINUED] tretinoin (RETIN-A) 0.025 % gel   . [DISCONTINUED] triamcinolone cream (KENALOG) 0.1 %    Facility-Administered Encounter Medications as of 07/28/2016  Medication  . hyoscyamine (LEVSIN SL) SL tablet 0.25 mg     Objective: Blood pressure 96/62, pulse 62, temperature 97.5 F (36.4 C), height 5\' 11"  (1.803 m), weight 144 lb (65.3 kg). Patient is alert and in no acute distress. Conjunctiva is pink. Sclera is nonicteric Oropharyngeal mucosa is normal. No neck masses or thyromegaly noted. Cardiac exam with regular rhythm normal S1 and S2. No murmur or gallop noted. Lungs are clear to auscultation. Abdomen is flat. Bowel sounds are normal. Abdomen is soft with mild tenderness at LLQ and left mid abdomen. No organomegaly or masses. No LE edema or clubbing noted.  Labs/studies Results: Blood work from 07/22/2016  TSH 5.210  Serum B12 to 13 and folate greater than 20 Methylmalonic acid 25(normal).  Assessment:  #1. Irritable bowel syndrome with constipation. She is doing much better with Plecanatide daily and when necessary polyethylene glycol. She still has sense of incomplete evacuation and will benefit from increasing fiber in her diet. She is having less abdominal pain. #2. GERD. She is having frequent symptoms and will benefit from PPI therapy. #3. Weakness. Weakness is possibly secondary to her medications but will make sure she has not developed anemia since she has been having vaginal bleeding daily for 5 weeks. He has borderline TSH but it would not explain her weakness.   Plan:  Patient advised to increase intake of fiber rich foods such as apple and prunes. She can also switch to fiber rich cereal instead of eating lucky charms. Pantoprazole 40 mg by mouth every morning. New prescription given for Plecanatide milligrams by mouth every morning. Patient will go to the lab for CBC. Office visit in 4  months.

## 2016-07-29 ENCOUNTER — Ambulatory Visit (INDEPENDENT_AMBULATORY_CARE_PROVIDER_SITE_OTHER): Payer: BLUE CROSS/BLUE SHIELD | Admitting: Neurology

## 2016-07-29 DIAGNOSIS — R51 Headache with orthostatic component, not elsewhere classified: Secondary | ICD-10-CM

## 2016-07-29 DIAGNOSIS — H53131 Sudden visual loss, right eye: Secondary | ICD-10-CM | POA: Diagnosis not present

## 2016-07-29 DIAGNOSIS — G932 Benign intracranial hypertension: Secondary | ICD-10-CM | POA: Diagnosis not present

## 2016-07-29 DIAGNOSIS — R519 Headache, unspecified: Secondary | ICD-10-CM

## 2016-07-29 NOTE — Procedures (Signed)
    History:   Anita George is a 35 year old patient with a history of myalgias and paresthesias, she describes pain behind the right eye, and a sensation of a film over the eye. The patient is concerned that she may have multiple sclerosis even though her MRI of the brain is normal. The patient being evaluated for the visual symptoms.   Description: The visual evoked response test was performed today using 32 x 32 check sizes. The absolute latencies for the N1 and the P100 wave forms were within normal limits bilaterally. The amplitudes for the P100 wave forms were also within normal limits bilaterally. The visual acuity was 20/30 OD and 20/20 OS uncorrected.  Impression:  The visual evoked response test above was within normal limits bilaterally. No evidence of conduction slowing was seen within the anterior visual pathways on either side on today's evaluation.

## 2016-08-18 ENCOUNTER — Other Ambulatory Visit: Payer: Self-pay

## 2016-09-24 ENCOUNTER — Ambulatory Visit (HOSPITAL_COMMUNITY): Payer: Self-pay | Admitting: Psychiatry

## 2016-11-10 ENCOUNTER — Ambulatory Visit (INDEPENDENT_AMBULATORY_CARE_PROVIDER_SITE_OTHER): Payer: 59 | Admitting: Internal Medicine

## 2016-11-10 ENCOUNTER — Encounter (INDEPENDENT_AMBULATORY_CARE_PROVIDER_SITE_OTHER): Payer: Self-pay | Admitting: Internal Medicine

## 2016-11-10 VITALS — BP 94/60 | HR 64 | Temp 97.9°F | Resp 18 | Ht 71.0 in | Wt 151.0 lb

## 2016-11-10 DIAGNOSIS — R110 Nausea: Secondary | ICD-10-CM | POA: Diagnosis not present

## 2016-11-10 DIAGNOSIS — R1033 Periumbilical pain: Secondary | ICD-10-CM | POA: Diagnosis not present

## 2016-11-10 DIAGNOSIS — R1013 Epigastric pain: Secondary | ICD-10-CM | POA: Diagnosis not present

## 2016-11-10 DIAGNOSIS — K581 Irritable bowel syndrome with constipation: Secondary | ICD-10-CM | POA: Diagnosis not present

## 2016-11-10 LAB — HEPATIC FUNCTION PANEL
ALK PHOS: 47 U/L (ref 33–115)
ALT: 11 U/L (ref 6–29)
AST: 17 U/L (ref 10–30)
Albumin: 4.6 g/dL (ref 3.6–5.1)
BILIRUBIN DIRECT: 0.2 mg/dL (ref <=0.2)
BILIRUBIN INDIRECT: 1 mg/dL (ref 0.2–1.2)
TOTAL PROTEIN: 7.2 g/dL (ref 6.1–8.1)
Total Bilirubin: 1.2 mg/dL (ref 0.2–1.2)

## 2016-11-10 LAB — LIPASE: Lipase: 21 U/L (ref 7–60)

## 2016-11-10 NOTE — Progress Notes (Signed)
Presenting complaint;  Multiple symptoms including abdominal pain.  Database and Subjective:  Patient is 36 year old Caucasian female who is here for scheduled visit. She was last seen on 07/28/2016 for IBS-C, GERD and she was also complaining of weakness. CBC was normal. Her weakness was felt to be secondary to her medications. Now she comes in with multiple symptoms. She says she's been having epigastric and midabdominal pain for the last 2 months. Pain is occurring every day. She also has sharp left subcostal pain which occurs before and during bowel movements and has not changed. Epigastric and midabdominal pain is not relieved with defecation. She complains of nausea which is at times intense but no vomiting and she also complains of bloating. She describes is no pain to be burning pain. She states pain is worse in the late afternoon and evening. However she is not sure if eating makes his pain worse. She also complains of left flank pain as well as hypogastric pain. She denies hematuria but at times her urine has been cloudy. She denies dysuria as well. She states she was told a few years ago that she had kidney stone. She is not sure if she passed 1. She recalls it was in left kidney. She says her father has had kidney stones. She says she is eating snacks rather than regular meals. She has not lost any weight since her last visit in fact she has gained 7 pounds. She also complains of passing mucus without bowel movements. She says lately she has not been taking her laxatives on a regular basis. Even then she is having stool every day. While she also complains of intermittent pain under the left scapula.    Current Medications: Outpatient Encounter Prescriptions as of 11/10/2016  Medication Sig  . ketoconazole (NIZORAL) 2 % shampoo Wash scalp 1-2 times weekly. Let sit for 5 minutes then wash out  . methylphenidate (RITALIN) 20 MG tablet Take 20 mg by mouth as needed.   . metoprolol tartrate  (LOPRESSOR) 25 MG tablet Take 25 mg by mouth daily.   . pantoprazole (PROTONIX) 40 MG tablet Take 1 tablet (40 mg total) by mouth daily before supper.  . Plecanatide (TRULANCE) 3 MG TABS Take 1 tablet by mouth daily.  . polyethylene glycol (MIRALAX / GLYCOLAX) packet Take 17 g by mouth daily.  Marland Kitchen topiramate (TOPAMAX) 100 MG tablet Take 2 tablets (200 mg total) by mouth at bedtime.  . tretinoin (RETIN-A) 0.025 % gel Apply topically.  . triamcinolone cream (KENALOG) 0.1 % Apply topically.  . [DISCONTINUED] AVIANE 0.1-20 MG-MCG tablet   . [DISCONTINUED] LYRICA 50 MG capsule Take 50 mg by mouth 3 (three) times daily.   . [DISCONTINUED] meloxicam (MOBIC) 7.5 MG tablet Take 7.5 mg by mouth daily.   Facility-Administered Encounter Medications as of 11/10/2016  Medication  . hyoscyamine (LEVSIN SL) SL tablet 0.25 mg     Objective: Blood pressure 94/60, pulse 64, temperature 97.9 F (36.6 C), temperature source Oral, resp. rate 18, height 5\' 11"  (1.803 m), weight 151 lb (68.5 kg). Patient is alert and in no acute distress.  Conjunctiva is pink. Sclera is nonicteric Oropharyngeal mucosa is normal. No neck masses or thyromegaly noted. Cardiac exam with regular rhythm normal S1 and S2. No murmur or gallop noted. Lungs are clear to auscultation. Abdomen is flat. Bowel sounds are normal. On palpation abdomen is soft. She has mild tenderness in epigastric region. Likely region as well as the left costal margin right mid abdomen and LLQ.  Aorta is easily palpable but not expanded. No organomegaly or masses.  No LE edema or clubbing noted.  Labs/studies Results:  Abdominal CT from 10/10/2012 reviewed.  Normal study without evidence of urolithiasis.    #1. Abdominal pain. She has epigastric and midabdominal pain as well as left-sided pain. Left-sided pain appears to be due to IBS because it occurs before and during defecation but the gastric and midabdominal pain does not appear to be due to IBS. This  pain is associated with nausea and bloating. She also complains of pain in left scapular region which raises possibility of biliary tract disease. She is not needing medication for constipation on regular basis like she has and therefore she could have low-grade colitis or enteritis. Pain is not typical of pancreatitis.  Finally she is also having left flank pain and therefore could also have urolithiasis.However urolithiasis would not explain all of her symptoms.  #2. Nausea secondary to above. #3. GERD. Heartburn is well controlled with therapy.   Plan:  Patient will go to the lab for LFTs serum amylase and urinalysis. Abdominopelvic CT with contrast. Medication list updated and she can use polyethylene glycol or Plecanatide on as-needed basis. Patient  advised to keep symptom diary as to duration of her pain and associated symptoms. If CT is negative with proceed with abdominal ultrasound primarily to look for cholesterol stones which would not be obvious on CT. Office visit in 4 months.

## 2016-11-10 NOTE — Patient Instructions (Signed)
Physician will call with results of tests when completed. 

## 2016-11-11 LAB — URINALYSIS
Bilirubin Urine: NEGATIVE
GLUCOSE, UA: NEGATIVE
HGB URINE DIPSTICK: NEGATIVE
KETONES UR: NEGATIVE
Leukocytes, UA: NEGATIVE
Nitrite: NEGATIVE
PH: 7 (ref 5.0–8.0)
Protein, ur: NEGATIVE
SPECIFIC GRAVITY, URINE: 1.019 (ref 1.001–1.035)

## 2016-11-20 ENCOUNTER — Encounter (HOSPITAL_COMMUNITY): Payer: Self-pay | Admitting: Radiology

## 2016-11-20 ENCOUNTER — Ambulatory Visit (HOSPITAL_COMMUNITY)
Admission: RE | Admit: 2016-11-20 | Discharge: 2016-11-20 | Disposition: A | Discharge status: Home / Self Care | Payer: 59 | Source: Ambulatory Visit | Attending: Internal Medicine | Admitting: Internal Medicine

## 2016-11-20 DIAGNOSIS — R195 Other fecal abnormalities: Secondary | ICD-10-CM | POA: Insufficient documentation

## 2016-11-20 DIAGNOSIS — R1033 Periumbilical pain: Secondary | ICD-10-CM | POA: Diagnosis not present

## 2016-11-20 DIAGNOSIS — R109 Unspecified abdominal pain: Secondary | ICD-10-CM | POA: Diagnosis not present

## 2016-11-20 DIAGNOSIS — Q433 Congenital malformations of intestinal fixation: Secondary | ICD-10-CM | POA: Diagnosis not present

## 2016-11-20 DIAGNOSIS — R1013 Epigastric pain: Secondary | ICD-10-CM | POA: Diagnosis present

## 2016-11-20 LAB — POCT PREGNANCY, URINE: PREG TEST UR: NEGATIVE

## 2016-11-20 IMAGING — CT CT ABD-PELV W/ CM
2 of 4 series · 16 of 46 positions shown, 18 images · IV contrast (Isovue)
Comparison: [DATE]

CLINICAL DATA: Left abdominal pain and epigastric pain for 2
months.

EXAM:
CT ABDOMEN AND PELVIS WITH CONTRAST
TECHNIQUE: Multidetector CT imaging of the abdomen and pelvis was performed
using the standard protocol following bolus administration of
intravenous contrast.
CONTRAST:  100mL [J0] IOPAMIDOL ([J0]) INJECTION 61%

[Series 2: axial st · axial · 0.64mm/px · z∈[+937,+1397]mm · 13 of 100 slices shown, 15 images]
[im 4/100  soft-tissue]
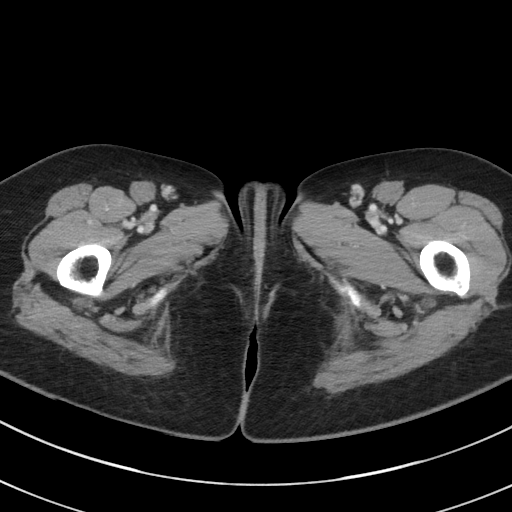
[im 4/100  bone]
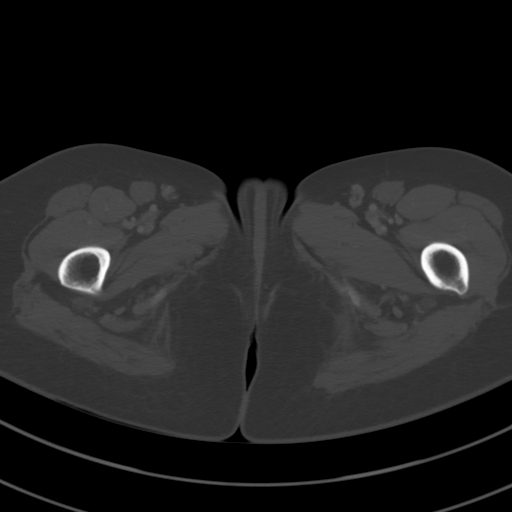
[im 12/100  soft-tissue]
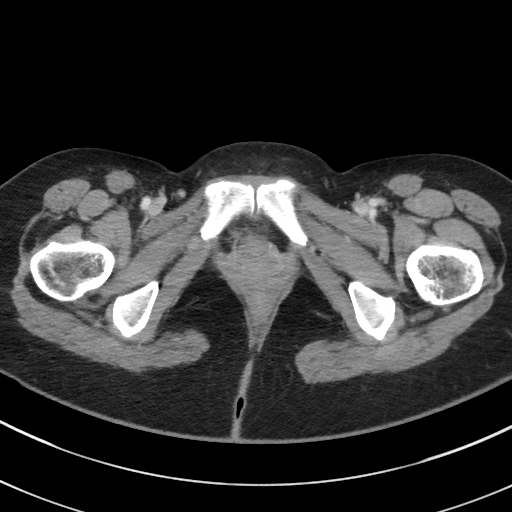
[im 20/100  soft-tissue]
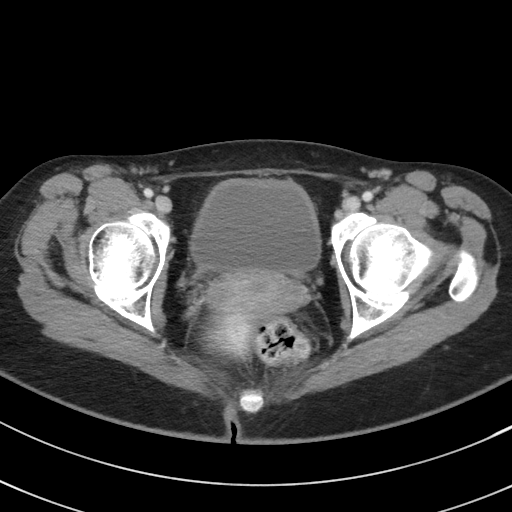
[im 27/100  soft-tissue]
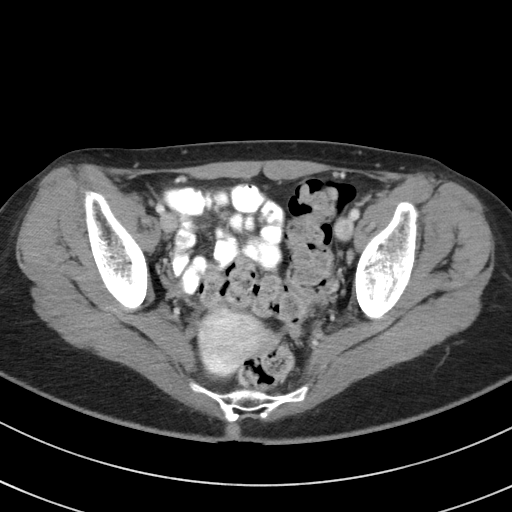
[im 35/100  soft-tissue]
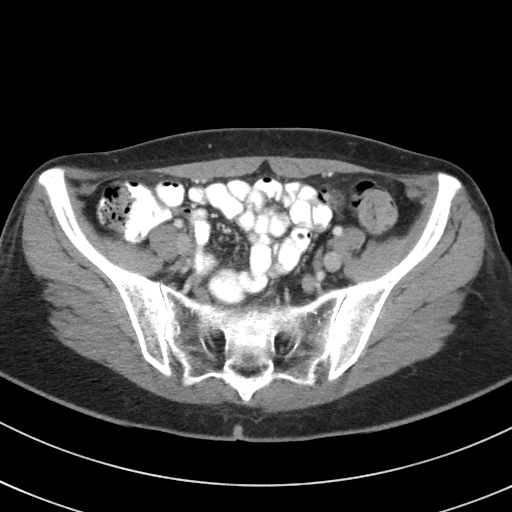
[im 42/100  soft-tissue]
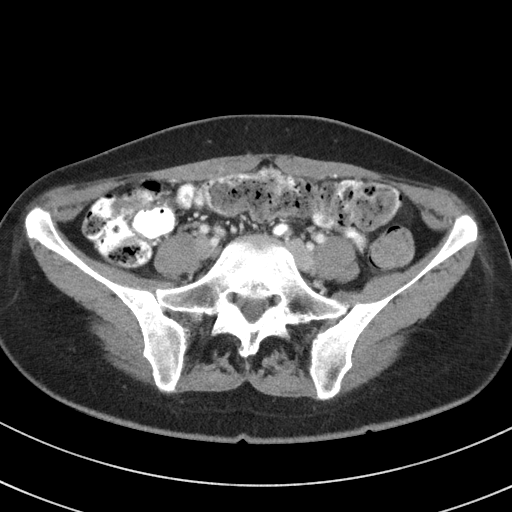
[im 50/100  soft-tissue]
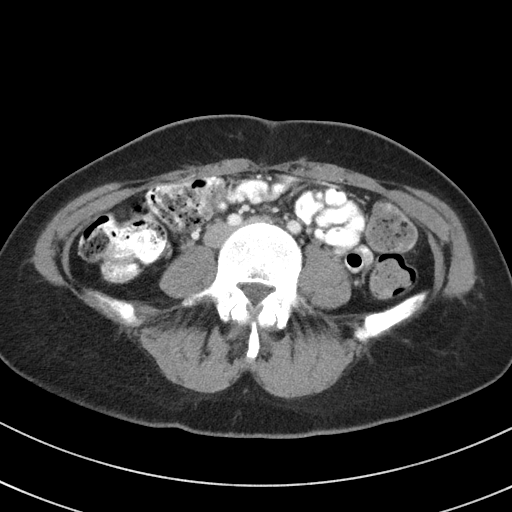
[im 58/100  soft-tissue]
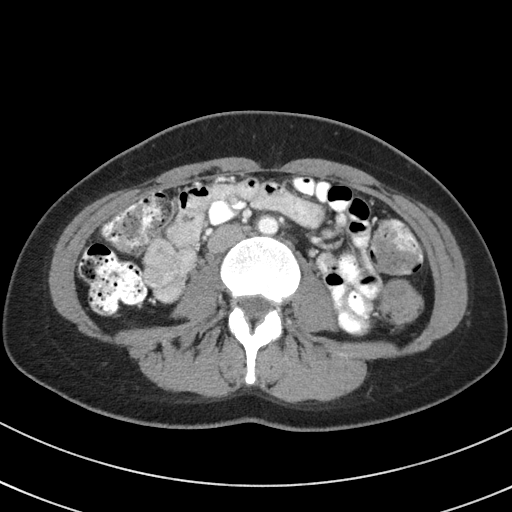
[im 65/100  soft-tissue]
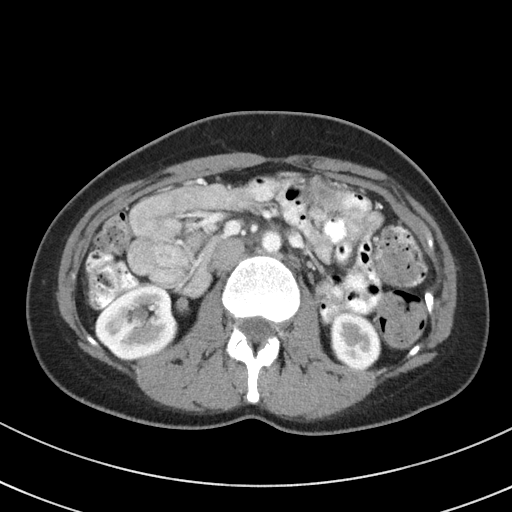
[im 65/100  bone]
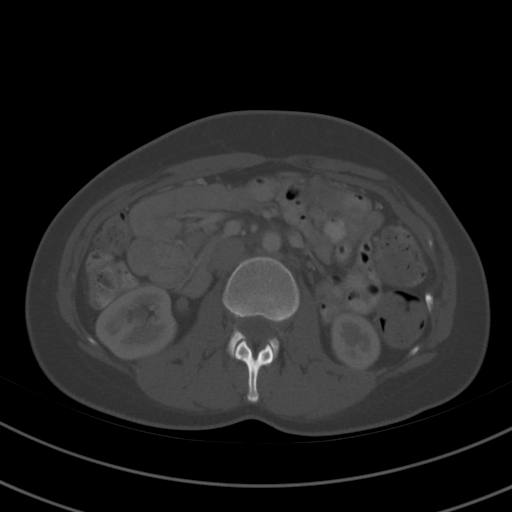
[im 73/100  soft-tissue]
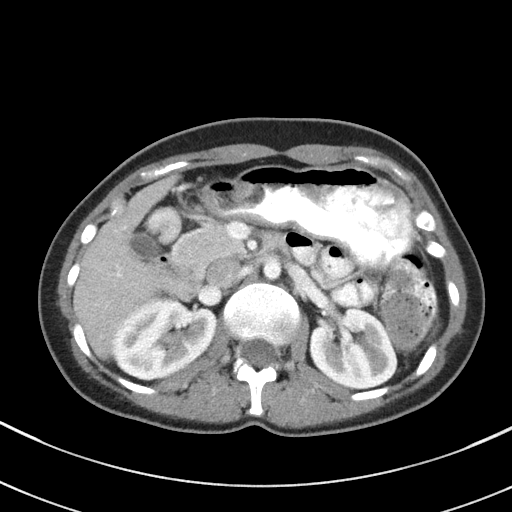
[im 80/100  soft-tissue]
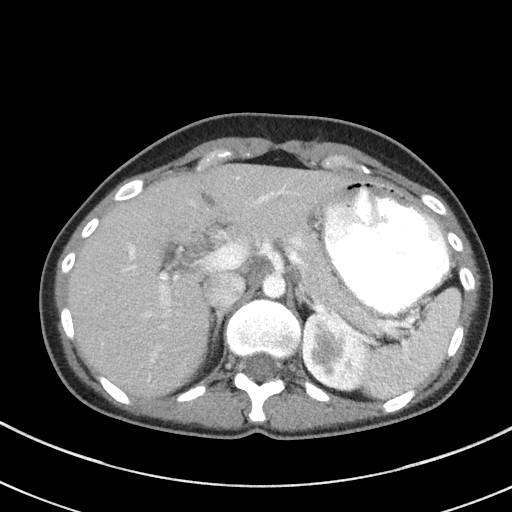
[im 88/100  soft-tissue]
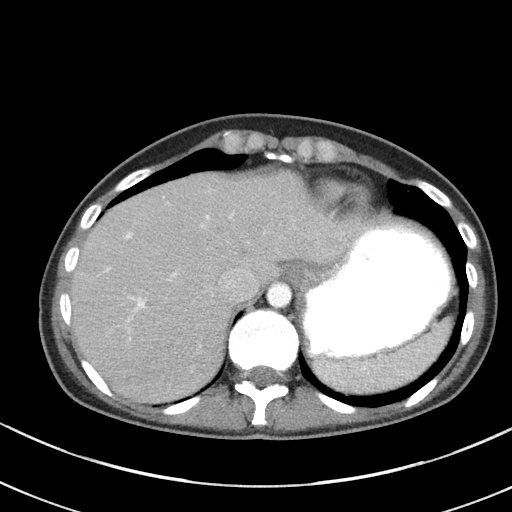
[im 96/100  soft-tissue]
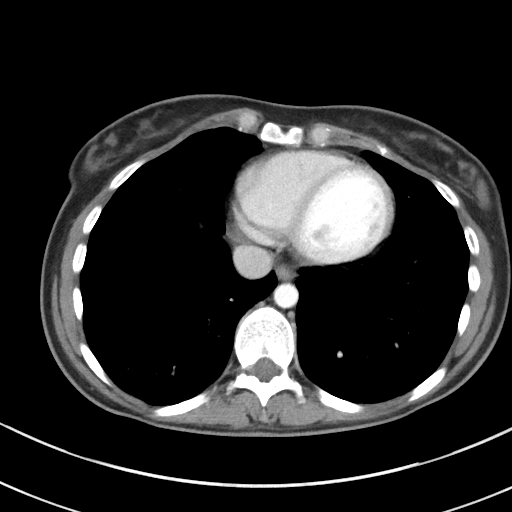

[Series 5: coronal st · coronal · 0.73mm/px · 3 of 86 slices shown]
[im 29/86  soft-tissue]
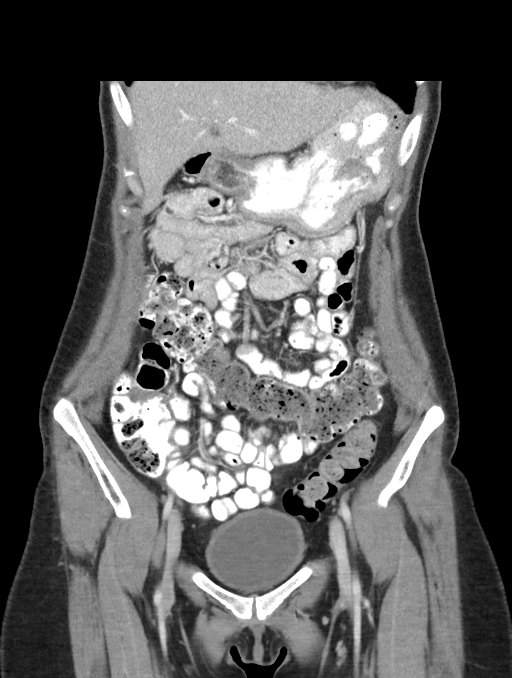
[im 38/86  soft-tissue]
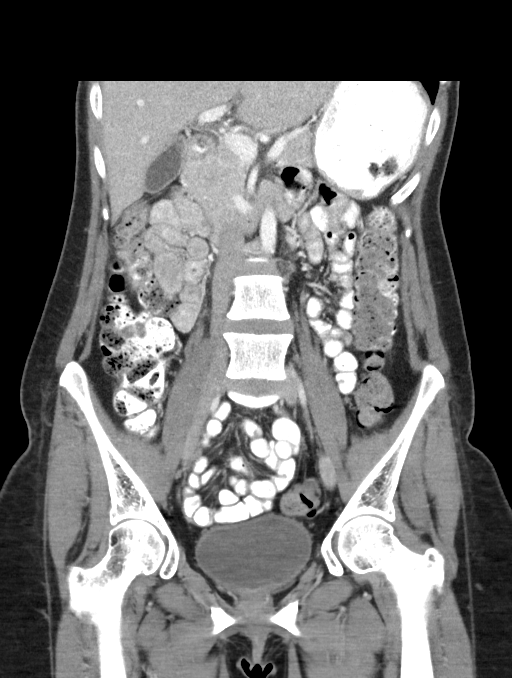
[im 48/86  soft-tissue]
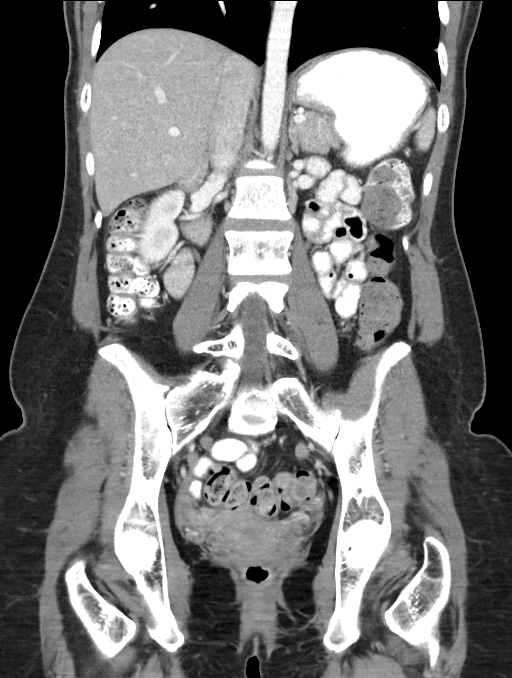

[16 of 46 positions shown; findings below may reference images not displayed]

FINDINGS: Lower Chest: No acute findings.

Hepatobiliary:  No masses identified. Gallbladder is unremarkable.

Pancreas:  No mass or inflammatory changes.

Spleen: Within normal limits in size and appearance.

Adrenals/Urinary Tract: No masses identified. No evidence of
hydronephrosis.

Stomach/Bowel: Incomplete rotation of small bowel is seen with
proximal jejunum located in the right abdomen. Normal position of
cecum and right lower quadrant. No evidence of obstruction,
inflammatory process or abnormal fluid collections. Large amount of
colonic stool noted.

Vascular/Lymphatic: No pathologically enlarged lymph nodes. No
abdominal aortic aneurysm.

Reproductive:  No mass identified.

Other:  None.

Musculoskeletal:  No suspicious bone lesions identified.
IMPRESSION: No acute findings.

Congenital small bowel malrotation incidentally noted. No evidence
of volvulus or other complication.

Large stool burden noted; recommend clinical correlation for
possible constipation.

## 2016-11-20 MED ORDER — IOPAMIDOL (ISOVUE-300) INJECTION 61%
100.0000 mL | Freq: Once | INTRAVENOUS | Status: AC | PRN
  Administered 2016-11-20: 100 mL via INTRAVENOUS

## 2016-11-24 ENCOUNTER — Ambulatory Visit (INDEPENDENT_AMBULATORY_CARE_PROVIDER_SITE_OTHER): Payer: Self-pay | Admitting: Internal Medicine

## 2017-01-11 DIAGNOSIS — D8989 Other specified disorders involving the immune mechanism, not elsewhere classified: Secondary | ICD-10-CM | POA: Diagnosis not present

## 2017-01-11 DIAGNOSIS — R21 Rash and other nonspecific skin eruption: Secondary | ICD-10-CM | POA: Diagnosis not present

## 2017-01-11 DIAGNOSIS — R768 Other specified abnormal immunological findings in serum: Secondary | ICD-10-CM | POA: Diagnosis not present

## 2017-01-27 DIAGNOSIS — M797 Fibromyalgia: Secondary | ICD-10-CM | POA: Diagnosis not present

## 2017-01-27 DIAGNOSIS — M329 Systemic lupus erythematosus, unspecified: Secondary | ICD-10-CM | POA: Diagnosis not present

## 2017-01-27 DIAGNOSIS — Q0700 Arnold-Chiari syndrome without spina bifida or hydrocephalus: Secondary | ICD-10-CM | POA: Diagnosis not present

## 2017-01-27 DIAGNOSIS — R002 Palpitations: Secondary | ICD-10-CM | POA: Diagnosis not present

## 2017-03-18 DIAGNOSIS — R002 Palpitations: Secondary | ICD-10-CM | POA: Diagnosis not present

## 2017-03-18 DIAGNOSIS — Q0700 Arnold-Chiari syndrome without spina bifida or hydrocephalus: Secondary | ICD-10-CM | POA: Diagnosis not present

## 2017-03-18 DIAGNOSIS — Z1389 Encounter for screening for other disorder: Secondary | ICD-10-CM | POA: Diagnosis not present

## 2017-03-18 DIAGNOSIS — M329 Systemic lupus erythematosus, unspecified: Secondary | ICD-10-CM | POA: Diagnosis not present

## 2017-03-23 ENCOUNTER — Ambulatory Visit (INDEPENDENT_AMBULATORY_CARE_PROVIDER_SITE_OTHER): Payer: Self-pay | Admitting: Internal Medicine

## 2017-04-27 ENCOUNTER — Telehealth (HOSPITAL_COMMUNITY): Payer: Self-pay | Admitting: *Deleted

## 2017-04-27 NOTE — Telephone Encounter (Signed)
returned phone call to schedule appointment, mail box is full.  could not leave message.

## 2017-04-29 ENCOUNTER — Encounter (HOSPITAL_COMMUNITY): Payer: Self-pay | Admitting: Psychiatry

## 2017-04-29 ENCOUNTER — Ambulatory Visit (INDEPENDENT_AMBULATORY_CARE_PROVIDER_SITE_OTHER): Payer: 59 | Admitting: Psychiatry

## 2017-04-29 VITALS — BP 104/51 | HR 69 | Ht 71.0 in | Wt 157.8 lb

## 2017-04-29 DIAGNOSIS — Z811 Family history of alcohol abuse and dependence: Secondary | ICD-10-CM | POA: Diagnosis not present

## 2017-04-29 DIAGNOSIS — M083 Juvenile rheumatoid polyarthritis (seronegative): Secondary | ICD-10-CM

## 2017-04-29 DIAGNOSIS — Z813 Family history of other psychoactive substance abuse and dependence: Secondary | ICD-10-CM

## 2017-04-29 DIAGNOSIS — Z818 Family history of other mental and behavioral disorders: Secondary | ICD-10-CM | POA: Diagnosis not present

## 2017-04-29 DIAGNOSIS — F331 Major depressive disorder, recurrent, moderate: Secondary | ICD-10-CM | POA: Diagnosis not present

## 2017-04-29 DIAGNOSIS — Z560 Unemployment, unspecified: Secondary | ICD-10-CM

## 2017-04-29 DIAGNOSIS — Z87891 Personal history of nicotine dependence: Secondary | ICD-10-CM | POA: Diagnosis not present

## 2017-04-29 DIAGNOSIS — R5382 Chronic fatigue, unspecified: Secondary | ICD-10-CM | POA: Diagnosis not present

## 2017-04-29 MED ORDER — CLONAZEPAM 0.5 MG PO TABS: 0.5000 mg | ORAL_TABLET | Freq: Two times a day (BID) | ORAL | 2 refills | Status: DC | PRN

## 2017-04-29 MED ORDER — METHYLPHENIDATE HCL 10 MG PO TABS
10.0000 mg | ORAL_TABLET | Freq: Two times a day (BID) | ORAL | 0 refills | Status: DC
Start: 2017-04-29 — End: 2017-05-27
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
## 2017-05-05 DIAGNOSIS — R21 Rash and other nonspecific skin eruption: Secondary | ICD-10-CM | POA: Diagnosis not present

## 2017-05-05 DIAGNOSIS — M2550 Pain in unspecified joint: Secondary | ICD-10-CM | POA: Diagnosis not present

## 2017-05-05 DIAGNOSIS — R768 Other specified abnormal immunological findings in serum: Secondary | ICD-10-CM | POA: Diagnosis not present

## 2017-05-27 ENCOUNTER — Encounter (HOSPITAL_COMMUNITY): Payer: Self-pay | Admitting: Psychiatry

## 2017-05-27 ENCOUNTER — Ambulatory Visit (INDEPENDENT_AMBULATORY_CARE_PROVIDER_SITE_OTHER): Payer: 59 | Admitting: Psychiatry

## 2017-05-27 VITALS — BP 108/68 | Ht 71.0 in | Wt 158.0 lb

## 2017-05-27 DIAGNOSIS — Z87891 Personal history of nicotine dependence: Secondary | ICD-10-CM | POA: Diagnosis not present

## 2017-05-27 DIAGNOSIS — Z811 Family history of alcohol abuse and dependence: Secondary | ICD-10-CM

## 2017-05-27 DIAGNOSIS — F331 Major depressive disorder, recurrent, moderate: Secondary | ICD-10-CM

## 2017-05-27 DIAGNOSIS — F419 Anxiety disorder, unspecified: Secondary | ICD-10-CM

## 2017-05-27 DIAGNOSIS — Z813 Family history of other psychoactive substance abuse and dependence: Secondary | ICD-10-CM

## 2017-05-27 DIAGNOSIS — Z818 Family history of other mental and behavioral disorders: Secondary | ICD-10-CM

## 2017-05-27 DIAGNOSIS — Z79899 Other long term (current) drug therapy: Secondary | ICD-10-CM

## 2017-05-27 MED ORDER — METHYLPHENIDATE HCL 10 MG PO TABS
10.0000 mg | ORAL_TABLET | Freq: Two times a day (BID) | ORAL | 0 refills | Status: DC
Start: 2017-05-27 — End: 2019-02-10

## 2017-05-27 MED ORDER — ALPRAZOLAM 0.25 MG PO TABS: 0.2500 mg | ORAL_TABLET | Freq: Two times a day (BID) | ORAL | 2 refills | Status: AC | PRN

## 2017-05-27 MED ORDER — METHYLPHENIDATE HCL 10 MG PO TABS: 10.0000 mg | ORAL_TABLET | Freq: Two times a day (BID) | ORAL | 0 refills | Status: DC

## 2017-05-27 MED ORDER — IMIPRAMINE HCL 10 MG PO TABS: 10.0000 mg | ORAL_TABLET | Freq: Every day | ORAL | 2 refills | Status: DC
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
## 2017-06-16 DIAGNOSIS — G2581 Restless legs syndrome: Secondary | ICD-10-CM | POA: Diagnosis not present

## 2017-06-16 DIAGNOSIS — M797 Fibromyalgia: Secondary | ICD-10-CM | POA: Diagnosis not present

## 2017-06-16 DIAGNOSIS — M329 Systemic lupus erythematosus, unspecified: Secondary | ICD-10-CM | POA: Diagnosis not present

## 2017-06-16 DIAGNOSIS — N200 Calculus of kidney: Secondary | ICD-10-CM | POA: Diagnosis not present

## 2017-06-22 DIAGNOSIS — N200 Calculus of kidney: Secondary | ICD-10-CM | POA: Diagnosis not present

## 2017-06-22 DIAGNOSIS — R319 Hematuria, unspecified: Secondary | ICD-10-CM | POA: Diagnosis not present

## 2017-06-22 DIAGNOSIS — R109 Unspecified abdominal pain: Secondary | ICD-10-CM | POA: Diagnosis not present

## 2017-07-08 ENCOUNTER — Ambulatory Visit (HOSPITAL_COMMUNITY): Payer: 59 | Admitting: Psychiatry

## 2017-07-21 ENCOUNTER — Ambulatory Visit: Payer: 59 | Admitting: Obstetrics and Gynecology

## 2017-07-21 ENCOUNTER — Encounter: Payer: Self-pay | Admitting: Obstetrics and Gynecology

## 2017-07-21 VITALS — BP 110/60 | HR 91 | Ht 71.0 in | Wt 161.0 lb

## 2017-07-21 DIAGNOSIS — R102 Pelvic and perineal pain: Secondary | ICD-10-CM | POA: Diagnosis not present

## 2017-07-21 NOTE — Progress Notes (Signed)
Patient ID: Anita George, female   DOB: [birth year], 36 y.o.   MRN: [id number]   [facility] Clinic Visit  @DATE @            Patient name: Anita George MRN [id number]  Date of birth: Jul 09, 1981  CC & HPI:  Anita George is a 36 y.o. female presenting today for pelvic pain that has been on-going for a few years. She reports back in 2014 she noticed a yellowish, vaginal discharge with an odor. She was put on birth control pills, but eventually had to stop because they negatively affected her Arnold-Chiari malformation, causing her headaches. This is when she switched to the IUD, Skyla. During this time she began experiencing dyspareunia, which she still endorses, but notes that the pain is mild. She also has occasional vaginal bleeding during intercourse. Patient reports that pressure to her abdomen exacerbates her pain and that her bowel movements feel as if they are scraping her insides. About a year ago she had the IUD removed and her and her husband use condoms for birth control. She denies fever, chills, or any other symptoms or complaints at this time.   ROS:  ROS +pelvic pain +dyspareunia +vaginal bleeding during intercourse +abdominal pressure All systems are negative except as noted in the HPI and PMH.   Pertinent History Reviewed:   Reviewed:  Medical         Past Medical History:  Diagnosis Date  . Arnold-Chiari malformation (HCC)   . AS (ankylosing spondylitis) (HCC)   . Depression   . Fatigue   . Fibromyalgia   . Headache   . Hiatal hernia   . Juvenile rheumatoid arthritis (HCC)   . Lupus (systemic lupus erythematosus) (HCC)   . Palpitations   . Pericardial effusion   . Shortness of breath   . Ventricular tachycardia (HCC)    nonsutained by cardiac monitor November 21, 2008 no other arrhythmias and negative workup for structural heart diseas e(normal echo)                              Surgical Hx:    Past Surgical History:  Procedure Laterality Date  . NO  PAST SURGERIES    . UPPER GASTROINTESTINAL ENDOSCOPY  2014   Dr.Benson @ [facility]   Medications: Reviewed & Updated - see associated section                       Current Outpatient Medications:  .  ALPRAZolam (XANAX) 0.25 MG tablet, Take 1 tablet (0.25 mg total) by mouth 2 (two) times daily as needed for anxiety., Disp: 60 tablet, Rfl: 2 .  methylphenidate (RITALIN) 10 MG tablet, Take 1 tablet (10 mg total) by mouth 2 (two) times daily with breakfast and lunch., Disp: 60 tablet, Rfl: 0 .  metoprolol tartrate (LOPRESSOR) 25 MG tablet, Take 25 mg by mouth daily. , Disp: , Rfl:  .  Plecanatide (TRULANCE) 3 MG TABS, Take 1 tablet by mouth daily as needed., Disp: 30 tablet, Rfl: 5 .  polyethylene glycol (MIRALAX / GLYCOLAX) packet, Take 17 g by mouth daily as needed., Disp: 14 each, Rfl:  .  topiramate (TOPAMAX) 100 MG tablet, Take 2 tablets (200 mg total) by mouth at bedtime., Disp: 60 tablet, Rfl: 6 .  imipramine (TOFRANIL) 10 MG tablet, Take 1 tablet (10 mg total) by mouth at bedtime. (Patient not taking: Reported on 07/21/2017), Disp:  30 tablet, Rfl: 2 .  ketoconazole (NIZORAL) 2 % shampoo, Wash scalp 1-2 times weekly. Let sit for 5 minutes then wash out, Disp: , Rfl:  .  methylphenidate (RITALIN) 10 MG tablet, Take 1 tablet (10 mg total) by mouth 2 (two) times daily with breakfast and lunch. (Patient not taking: Reported on 07/21/2017), Disp: 60 tablet, Rfl: 0 .  pantoprazole (PROTONIX) 40 MG tablet, Take 1 tablet (40 mg total) by mouth daily before supper. (Patient not taking: Reported on 04/29/2017), Disp: 30 tablet, Rfl: 5 .  tretinoin (RETIN-A) 0.025 % gel, Apply topically., Disp: , Rfl:   Current Facility-Administered Medications:  .  hyoscyamine (LEVSIN SL) SL tablet 0.25 mg, 0.25 mg, Sublingual, BID PRN, Rehman, Joline MaxcyNajeeb U, MD   Social History: Reviewed -  reports that she quit smoking about 9 years ago. Her smoking use included cigarettes. She has a 22.00 pack-year smoking history. she has  never used smokeless tobacco.  Objective Findings:  Vitals: Blood pressure 110/60, pulse 91, height 5\' 11"  (1.803 m), weight 161 lb (73 kg), last menstrual period 07/15/2017.  PHYSICAL EXAMINATION  General appearance: alert, well appearing, and in no distress and oriented to person, place, and time Mental status: alert, oriented to person, place, and time, normal mood, behavior, speech, dress, motor activity, and thought processes, affect appropriate to mood PELVIC Vulva: normal anatomy Vagina: normal Cervix: multiparous Uterus: Retroverted,  Adnexa: negative  Assessment & Plan:   A:  1. Pelvic pain, uterine retroversion  P:  1. F/U Monday 08/02/2017 2. Patient keep menstrual calendar and pain calendar and return in 1 month for further discussion   By signing my name below, I, Diona BrownerJennifer Gorman, attest that this documentation has been prepared under the direction and in the presence of Tilda BurrowFerguson, Edda Orea V, MD. Electronically Signed: Diona BrownerJennifer Gorman, Medical Scribe. 07/21/17. 10:54 AM.  I personally performed the services described in this documentation, which was SCRIBED in my presence. The recorded information has been reviewed and considered accurate. It has been edited as necessary during review. Tilda BurrowJohn V Amelya Mabry, MD

## 2017-07-28 ENCOUNTER — Other Ambulatory Visit: Payer: Self-pay | Admitting: Obstetrics and Gynecology

## 2017-07-28 DIAGNOSIS — R102 Pelvic and perineal pain: Secondary | ICD-10-CM

## 2017-08-02 ENCOUNTER — Other Ambulatory Visit: Payer: 59

## 2017-08-02 ENCOUNTER — Ambulatory Visit: Payer: 59 | Admitting: Obstetrics and Gynecology

## 2017-11-25 DIAGNOSIS — L52 Erythema nodosum: Secondary | ICD-10-CM | POA: Diagnosis not present

## 2017-11-25 DIAGNOSIS — L309 Dermatitis, unspecified: Secondary | ICD-10-CM | POA: Diagnosis not present

## 2017-11-25 DIAGNOSIS — L750 Bromhidrosis: Secondary | ICD-10-CM | POA: Diagnosis not present

## 2018-01-06 DIAGNOSIS — M545 Low back pain: Secondary | ICD-10-CM | POA: Diagnosis not present

## 2018-01-06 DIAGNOSIS — M351 Other overlap syndromes: Secondary | ICD-10-CM | POA: Diagnosis not present

## 2018-01-06 DIAGNOSIS — Q0700 Arnold-Chiari syndrome without spina bifida or hydrocephalus: Secondary | ICD-10-CM | POA: Diagnosis not present

## 2018-03-30 DIAGNOSIS — G935 Compression of brain: Secondary | ICD-10-CM | POA: Diagnosis not present

## 2018-03-30 DIAGNOSIS — M542 Cervicalgia: Secondary | ICD-10-CM | POA: Diagnosis not present

## 2018-04-14 DIAGNOSIS — M5416 Radiculopathy, lumbar region: Secondary | ICD-10-CM | POA: Diagnosis not present

## 2018-04-14 DIAGNOSIS — M5412 Radiculopathy, cervical region: Secondary | ICD-10-CM | POA: Diagnosis not present

## 2018-04-14 DIAGNOSIS — M5414 Radiculopathy, thoracic region: Secondary | ICD-10-CM | POA: Diagnosis not present

## 2018-04-14 DIAGNOSIS — G935 Compression of brain: Secondary | ICD-10-CM | POA: Diagnosis not present

## 2018-04-20 DIAGNOSIS — M7918 Myalgia, other site: Secondary | ICD-10-CM | POA: Diagnosis not present

## 2018-05-26 ENCOUNTER — Ambulatory Visit (INDEPENDENT_AMBULATORY_CARE_PROVIDER_SITE_OTHER): Payer: 59 | Admitting: Otolaryngology

## 2018-05-26 DIAGNOSIS — R0982 Postnasal drip: Secondary | ICD-10-CM

## 2018-05-26 DIAGNOSIS — H60333 Swimmer's ear, bilateral: Secondary | ICD-10-CM | POA: Diagnosis not present

## 2018-05-26 DIAGNOSIS — H903 Sensorineural hearing loss, bilateral: Secondary | ICD-10-CM | POA: Diagnosis not present

## 2018-05-26 DIAGNOSIS — H61813 Exostosis of external canal, bilateral: Secondary | ICD-10-CM

## 2018-06-15 DIAGNOSIS — M542 Cervicalgia: Secondary | ICD-10-CM | POA: Diagnosis not present

## 2018-06-15 DIAGNOSIS — M7918 Myalgia, other site: Secondary | ICD-10-CM | POA: Diagnosis not present

## 2018-06-30 DIAGNOSIS — R768 Other specified abnormal immunological findings in serum: Secondary | ICD-10-CM | POA: Diagnosis not present

## 2018-08-17 ENCOUNTER — Ambulatory Visit: Payer: 59 | Admitting: Obstetrics and Gynecology

## 2018-08-17 ENCOUNTER — Encounter: Payer: Self-pay | Admitting: Obstetrics and Gynecology

## 2018-08-17 ENCOUNTER — Other Ambulatory Visit: Payer: Self-pay

## 2018-08-17 VITALS — BP 103/60 | HR 54 | Ht 71.0 in | Wt 155.0 lb

## 2018-08-17 DIAGNOSIS — R102 Pelvic and perineal pain: Secondary | ICD-10-CM | POA: Diagnosis not present

## 2018-08-17 DIAGNOSIS — N854 Malposition of uterus: Secondary | ICD-10-CM

## 2018-08-17 NOTE — Patient Instructions (Addendum)
  Please research Anita George and communicate thru MyChart.

## 2018-08-17 NOTE — Progress Notes (Addendum)
Patient ID: RHONDA LINAN, female   DOB: July 19, 1981, 37 y.o.   MRN: [id number]    [facility] Clinic Visit  @DATE @            Patient name: STEPHAIE DARDIS MRN [id number]  Date of birth: 04-21-1981  CC & HPI:  CHERELL COLVIN is a 37 y.o. female presenting today for pelvic pain. Accompanied by boyfriend. When seen last she just had IUD removed and had right sided pain and periods were irregular. Her periods have since then become regular; she will spot for 5 day and 3 days of bleeding in the order of spotting then bleeding then spotting. She has constant cramping, and says stomach feels like cramping at present time of appt. The days of period she has severe cramping for 3 hours. She puts a heating pad on and lies down to alleviate pain but does not help she does not take pain medication for the cramping due to being on pain medication for the arthritis and fibromyalgia. 3 days that she is ovulation she had pelvic pain. Constant nagging pain in left side   Says arthritis started at age 7, fibromyalgia around age 42. 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 Hasn't had to use  stool softener due to Plaquenil medicine that keeps her regular. Only take ritalin if completely fatigued, takes about 2 hours to regain some energy and last roughly 5 hours ROS:  ROS +Pelvic pain +chronic cramping -fever -chills All systems are negative except as noted in the HPI and PMH.  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 Karilyn Cota "small or large intestine was all on left side"  Pertinent History Reviewed:   Reviewed: Medical         Past Medical History:  Diagnosis Date  . Arnold-Chiari malformation (HCC)   . AS (ankylosing spondylitis) (HCC)   . Depression   . Fatigue   . Fibromyalgia   . Headache   . Hiatal hernia   . Juvenile rheumatoid arthritis (HCC)   . Lupus (systemic lupus erythematosus)  (HCC)    mixed connective tissue disease per pt  . Palpitations   . Pericardial effusion   . Shortness of breath   . Ventricular tachycardia (HCC)    nonsutained by cardiac monitor November 21, 2008 no other arrhythmias and negative workup for structural heart diseas e(normal echo)                              Surgical Hx:    Past Surgical History:  Procedure Laterality Date  . NO PAST SURGERIES    . UPPER GASTROINTESTINAL ENDOSCOPY  2014   Dr.Benson @ [facility]   Medications: Reviewed & Updated - see associated section                       Current Outpatient Medications:  .  acetaZOLAMIDE (DIAMOX) 250 MG tablet, Take 250 mg by mouth 2 (two) times daily., Disp: , Rfl: 1 .  BELBUCA 75 MCG FILM, PLACE 1 FILM TWICE DAILY AGAINST THE INSIDE OF THE CHEEK HOLDING IN PLACE FOR 5 SECONDS, Disp: , Rfl: 2 .  hydroxychloroquine (PLAQUENIL) 200 MG tablet, Take 200 mg by mouth daily., Disp: , Rfl: 0 .  ipratropium (ATROVENT) 0.06 % nasal spray, USE 2 DOSES IN EACH NOSTRIL TWICE DAILY AS  NEEDED FOR DRAINAGE, Disp: , Rfl: 3 .  metoprolol tartrate (LOPRESSOR) 25 MG tablet, Take 25 mg by mouth daily. , Disp: , Rfl:  .  polyethylene glycol (MIRALAX / GLYCOLAX) packet, Take 17 g by mouth daily as needed., Disp: 14 each, Rfl:  .  topiramate (TOPAMAX) 100 MG tablet, Take 2 tablets (200 mg total) by mouth at bedtime., Disp: 60 tablet, Rfl: 6 .  tretinoin (RETIN-A) 0.025 % gel, Apply topically., Disp: , Rfl:  .  imipramine (TOFRANIL) 10 MG tablet, Take 1 tablet (10 mg total) by mouth at bedtime. (Patient not taking: Reported on 07/21/2017), Disp: 30 tablet, Rfl: 2 .  methylphenidate (RITALIN) 10 MG tablet, Take 1 tablet (10 mg total) by mouth 2 (two) times daily with breakfast and lunch., Disp: 60 tablet, Rfl: 0 .  methylphenidate (RITALIN) 10 MG tablet, Take 1 tablet (10 mg total) by mouth 2 (two) times daily with breakfast and lunch. (Patient not taking: Reported on 07/21/2017), Disp: 60 tablet, Rfl: 0  Current  Facility-Administered Medications:  .  hyoscyamine (LEVSIN SL) SL tablet 0.25 mg, 0.25 mg, Sublingual, BID PRN, Rehman, Joline Maxcy, MD   Social History: Reviewed -  reports that she quit smoking about 10 years ago. Her smoking use included cigarettes. She has a 22.00 pack-year smoking history. She has never used smokeless tobacco.  Objective Findings:  Vitals: Blood pressure 103/60, pulse (!) 54, height 5\' 11"  (1.803 m), weight 155 lb (70.3 kg), last menstrual period 07/29/2018.  PHYSICAL EXAMINATION General appearance - alert, well appearing, and in no distress and oriented to person, place, and time Mental status - alert, oriented to person, place, and time, normal mood, behavior, speech, dress, motor activity, and thought processes, affect appropriate to mood   PELVIC Vagina - short vaginal length Cervix - tiny well supported, normal in appearance Uterus - retroverted,  no masses felt on tubes or ovaries, no nodularity   Assessment & Plan:   A:  1. Chronic pelvic pain 2. Uterine retroversion with little pain noted during thorough exam.   P:  1. Review Dr. Patty Sermons note form 2018 2. Research [place] with pt , to see if trial is an option with Chiari Malformation and Headaches. 3. f/u in January trial of [place] 4. Vag U/s may be needed to confirm the normal retroverted uterus I suspect..      By signing my name below, I, Arnette Norris, attest that this documentation has been prepared under the direction and in the presence of Tilda Burrow, MD. Electronically Signed: Arnette Norris Medical Scribe. 08/17/18. 11:12 AM.  I personally performed the services described in this documentation, which was SCRIBED in my presence. The recorded information has been reviewed and considered accurate. It has been edited as necessary during review. Tilda Burrow, MD

## 2018-09-14 ENCOUNTER — Ambulatory Visit: Payer: 59 | Admitting: Obstetrics and Gynecology

## 2018-09-15 DIAGNOSIS — M542 Cervicalgia: Secondary | ICD-10-CM | POA: Diagnosis not present

## 2018-09-15 DIAGNOSIS — M7918 Myalgia, other site: Secondary | ICD-10-CM | POA: Diagnosis not present

## 2018-09-27 ENCOUNTER — Other Ambulatory Visit: Payer: 59

## 2018-09-27 ENCOUNTER — Other Ambulatory Visit: Payer: Self-pay | Admitting: Obstetrics and Gynecology

## 2018-09-27 DIAGNOSIS — R102 Pelvic and perineal pain: Secondary | ICD-10-CM

## 2018-09-28 ENCOUNTER — Telehealth: Payer: Self-pay | Admitting: *Deleted

## 2018-09-28 ENCOUNTER — Ambulatory Visit (INDEPENDENT_AMBULATORY_CARE_PROVIDER_SITE_OTHER): Payer: 59 | Admitting: Obstetrics and Gynecology

## 2018-09-28 ENCOUNTER — Ambulatory Visit (INDEPENDENT_AMBULATORY_CARE_PROVIDER_SITE_OTHER): Payer: 59

## 2018-09-28 VITALS — BP 98/57 | HR 60

## 2018-09-28 DIAGNOSIS — R102 Pelvic and perineal pain: Secondary | ICD-10-CM

## 2018-09-28 DIAGNOSIS — G894 Chronic pain syndrome: Secondary | ICD-10-CM

## 2018-09-28 NOTE — Telephone Encounter (Signed)
Opened in error

## 2018-09-28 NOTE — Progress Notes (Signed)
PELVIC US TA/TV: homogeneous retroverted uterus,wnl,EEC 6.4 mm,normal ovaries bilat,ovaries appear mobile,no pain during [employer] discussed results w/patient.

## 2018-09-28 NOTE — Progress Notes (Signed)
Patient ID: TASHYRA CIPOLLONE, female   DOB: 03/18/1981, 38 y.o.   MRN: [id number]   [facility] Visit  @DATE @            Patient name: Anita George MRN [id number]  Date of birth: 11-16-1980  CC & HPI:  Anita George is a 38 y.o. female presenting today for TA/TV u/s discussion. Has had IUD removed for about 2 years. Her periods were inconsistent after her IUD  Was removed but has regulated since her previous visit 08/17/2018. Constantly has pelvic pain even when not on period.  Sometimes she will have intermittent episodes of pain causing her to double over. She recently had a period where she had light bleeding for 2 days then stopped bleeding for a few days then had heavy bleeding for 3 days afterwards. Has noticed pain with intercourse for about 2 years  U/s done today in office: PELVIC US TA/TV: homogeneous retroverted uterus,wnl,EEC 6.4 mm,normal ovaries bilat,ovaries appear mobile,no pain during [employer] discussed results w/patient.  ROS:  ROS +chronic pelvic pain -ovarian cysts +retroverted uterus  All systems are negative except as noted in the HPI and PMH.   Pertinent History Reviewed:   Reviewed: Medical         Past Medical History:  Diagnosis Date  . Arnold-Chiari malformation (HCC)   . AS (ankylosing spondylitis) (HCC)   . Depression   . Fatigue   . Fibromyalgia   . Headache   . Hiatal hernia   . Juvenile rheumatoid arthritis (HCC)   . Lupus (systemic lupus erythematosus) (HCC)    mixed connective tissue disease per pt  . Palpitations   . Pericardial effusion   . Shortness of breath   . Ventricular tachycardia (HCC)    nonsutained by cardiac monitor November 21, 2008 no other arrhythmias and negative workup for structural heart diseas e(normal echo)                              Surgical Hx:    Past Surgical History:  Procedure Laterality Date  . NO PAST SURGERIES    . UPPER GASTROINTESTINAL ENDOSCOPY  2014   Dr.Benson @ [facility]    Medications: Reviewed & Updated - see associated section                       Current Outpatient Medications:  .  acetaZOLAMIDE (DIAMOX) 250 MG tablet, Take 250 mg by mouth 2 (two) times daily., Disp: , Rfl: 1 .  BELBUCA 75 MCG FILM, PLACE 1 FILM TWICE DAILY AGAINST THE INSIDE OF THE CHEEK HOLDING IN PLACE FOR 5 SECONDS, Disp: , Rfl: 2 .  hydroxychloroquine (PLAQUENIL) 200 MG tablet, Take 200 mg by mouth daily., Disp: , Rfl: 0 .  imipramine (TOFRANIL) 10 MG tablet, Take 1 tablet (10 mg total) by mouth at bedtime. (Patient not taking: Reported on 07/21/2017), Disp: 30 tablet, Rfl: 2 .  ipratropium (ATROVENT) 0.06 % nasal spray, USE 2 DOSES IN EACH NOSTRIL TWICE DAILY AS NEEDED FOR DRAINAGE, Disp: , Rfl: 3 .  methylphenidate (RITALIN) 10 MG tablet, Take 1 tablet (10 mg total) by mouth 2 (two) times daily with breakfast and lunch., Disp: 60 tablet, Rfl: 0 .  methylphenidate (RITALIN) 10 MG tablet, Take 1 tablet (10 mg total) by mouth 2 (two) times daily with breakfast and lunch. (Patient not taking: Reported on 07/21/2017), Disp: 60 tablet, Rfl: 0 .  metoprolol tartrate (LOPRESSOR) 25 MG tablet, Take 25 mg by mouth daily. , Disp: , Rfl:  .  polyethylene glycol (MIRALAX / GLYCOLAX) packet, Take 17 g by mouth daily as needed., Disp: 14 each, Rfl:  .  topiramate (TOPAMAX) 100 MG tablet, Take 2 tablets (200 mg total) by mouth at bedtime., Disp: 60 tablet, Rfl: 6 .  tretinoin (RETIN-A) 0.025 % gel, Apply topically., Disp: , Rfl:   Current Facility-Administered Medications:  .  hyoscyamine (LEVSIN SL) SL tablet 0.25 mg, 0.25 mg, Sublingual, BID PRN, Rehman, Joline Maxcy, MD   Social History: Reviewed -  reports that she quit smoking about 10 years ago. Her smoking use included cigarettes. She has a 22.00 pack-year smoking history. She has never used smokeless tobacco.  Objective Findings:  Vitals: Blood pressure (!) 98/57, pulse 60.  PHYSICAL EXAMINATION General appearance - alert, well appearing,  and in no distress, oriented to person, place, and time and normal appearing weight Mental status - normal mood, behavior, speech, dress, motor activity, and thought processes, affect appropriate to mood  PELVIC NOT DONE DISCUSSION OF U/S  Assessment & Plan:   A:  1. 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 2. Dyspaurenia  P:  1. PRN 2. Pt counselled that pelvic is very normal but pt pain tolerances are set on "super -sensitive" and normal functional pains are worse for her. 3. Pt accepts this as logical explanation.    By signing my name below, I, Arnette Norris, attest that this documentation has been prepared under the direction and in the presence of Tilda Burrow, MD. Electronically Signed: Arnette Norris Medical Scribe. 09/28/18. 11:12 AM.  I personally performed the services described in this documentation, which was SCRIBED in my presence. The recorded information has been reviewed and considered accurate. It has been edited as necessary during review. Tilda Burrow, MD

## 2018-10-13 ENCOUNTER — Telehealth (INDEPENDENT_AMBULATORY_CARE_PROVIDER_SITE_OTHER): Payer: Self-pay | Admitting: *Deleted

## 2018-10-13 MED ORDER — PANTOPRAZOLE SODIUM 40 MG PO TBEC: 40.0000 mg | DELAYED_RELEASE_TABLET | Freq: Every day | ORAL | 0 refills | Status: AC

## 2018-10-13 NOTE — Telephone Encounter (Signed)
Per Camelia Eng , the patient will need a OV prior to further refills. Patient has bad burning , horrible reflux and sore throat.

## 2018-11-02 ENCOUNTER — Ambulatory Visit (INDEPENDENT_AMBULATORY_CARE_PROVIDER_SITE_OTHER): Payer: Self-pay | Admitting: Internal Medicine

## 2018-12-15 DIAGNOSIS — M5030 Other cervical disc degeneration, unspecified cervical region: Secondary | ICD-10-CM | POA: Diagnosis not present

## 2018-12-15 DIAGNOSIS — G935 Compression of brain: Secondary | ICD-10-CM | POA: Diagnosis not present

## 2018-12-15 DIAGNOSIS — M542 Cervicalgia: Secondary | ICD-10-CM | POA: Diagnosis not present

## 2018-12-29 DIAGNOSIS — R768 Other specified abnormal immunological findings in serum: Secondary | ICD-10-CM | POA: Diagnosis not present

## 2019-01-05 DIAGNOSIS — R21 Rash and other nonspecific skin eruption: Secondary | ICD-10-CM | POA: Diagnosis not present

## 2019-01-05 DIAGNOSIS — Z6820 Body mass index (BMI) 20.0-20.9, adult: Secondary | ICD-10-CM | POA: Diagnosis not present

## 2019-01-26 DIAGNOSIS — M5136 Other intervertebral disc degeneration, lumbar region: Secondary | ICD-10-CM | POA: Diagnosis not present

## 2019-01-26 DIAGNOSIS — G935 Compression of brain: Secondary | ICD-10-CM | POA: Diagnosis not present

## 2019-01-26 DIAGNOSIS — M47816 Spondylosis without myelopathy or radiculopathy, lumbar region: Secondary | ICD-10-CM | POA: Diagnosis not present

## 2019-02-09 ENCOUNTER — Encounter: Payer: Self-pay | Admitting: *Deleted

## 2019-02-10 ENCOUNTER — Encounter: Payer: Self-pay | Admitting: Adult Health

## 2019-02-10 ENCOUNTER — Ambulatory Visit: Payer: 59 | Admitting: Adult Health

## 2019-02-10 ENCOUNTER — Other Ambulatory Visit: Payer: Self-pay

## 2019-02-10 VITALS — BP 96/60 | HR 63 | Temp 98.1°F | Ht 70.0 in | Wt 151.5 lb

## 2019-02-10 DIAGNOSIS — R309 Painful micturition, unspecified: Secondary | ICD-10-CM | POA: Diagnosis not present

## 2019-02-10 DIAGNOSIS — R350 Frequency of micturition: Secondary | ICD-10-CM | POA: Diagnosis not present

## 2019-02-10 DIAGNOSIS — R319 Hematuria, unspecified: Secondary | ICD-10-CM | POA: Diagnosis not present

## 2019-02-10 DIAGNOSIS — Z113 Encounter for screening for infections with a predominantly sexual mode of transmission: Secondary | ICD-10-CM | POA: Diagnosis not present

## 2019-02-10 LAB — POCT URINALYSIS DIPSTICK
Glucose, UA: NEGATIVE
Ketones, UA: NEGATIVE
Leukocytes, UA: NEGATIVE
Nitrite, UA: NEGATIVE
Protein, UA: NEGATIVE

## 2019-02-10 MED ORDER — SULFAMETHOXAZOLE-TRIMETHOPRIM 800-160 MG PO TABS: 1.0000 | ORAL_TABLET | Freq: Two times a day (BID) | ORAL | 0 refills | Status: DC

## 2019-02-10 MED ORDER — PHENAZOPYRIDINE HCL 200 MG PO TABS: 200.0000 mg | ORAL_TABLET | Freq: Three times a day (TID) | ORAL | 0 refills | Status: DC | PRN

## 2019-02-10 NOTE — Progress Notes (Addendum)
Patient ID: Anita George, female   DOB: Jun 02, 1981, 38 y.o.   MRN: [id number] History of Present Illness: Anita George is a 38 year old white female, married, [id number], in complaining of urinary frequency and pain with urination for about 3 weeks.Has pelvic pain, which seems to be chronic, and no pain with sex. PCP is Dr Dimas Aguas.    Current Medications, Allergies, Past Medical History, Past Surgical History, Family History and Social History were reviewed in [employer] record.     Review of Systems:  +urinary frequency +pain with urination +pelvic pain No pain with sex   Physical Exam:BP 96/60 (BP Location: Left Arm, Patient Position: Sitting, Cuff Size: Normal)   Pulse 63   Temp 98.1 F (36.7 C)   Ht 5\' 10"  (1.778 m)   Wt 151 lb 8 oz (68.7 kg)   LMP 02/04/2019   BMI 21.74 kg/m  General:  Well developed, well nourished, no acute distress Skin:  Warm and dry Abdomen:  Soft, non tender, no hepatosplenomegaly Pelvic:  External genitalia is normal in appearance, no lesions.  The vagina is normal in appearance,scant blood.Marland Kitchen Urethra has no lesions or masses. The cervix is smooth.  Uterus is felt to be normal size, shape, and contour.  No adnexal masses or tenderness noted.Bladder is non tender, no masses felt.She requests GC/CHL, on urine. No CVAT. Psych:  No mood changes, alert and cooperative,seems happy Fall risk is low. Examination chaperoned by Malachy Mood LPN. Discussed with will treat for UTI, and that she may have some IC, and if feels better on pyridium, will get appt with Dr Despina Hidden.  Impression and Plan: 1. Urinary frequency Will send urine for UA C&S and Rx septra ds and pyridium  - POCT Urinalysis Dipstick - Urinalysis, Routine w reflex microscopic - GC/Chlamydia Probe Amp - Urine Culture -Push fluids   2. Pain with urination - POCT Urinalysis Dipstick - GC/Chlamydia Probe Amp - Urine Culture  3. Hematuria, unspecified type - GC/Chlamydia Probe  Amp - Urine Culture Meds ordered this encounter  Medications  . sulfamethoxazole-trimethoprim (BACTRIM DS) 800-160 MG tablet    Sig: Take 1 tablet by mouth 2 (two) times daily. Take 1 bid    Dispense:  14 tablet    Refill:  0    Order Specific Question:   Supervising Provider    Answer:   Despina Hidden, LUTHER H [[id number]]  . phenazopyridine (PYRIDIUM) 200 MG tablet    Sig: Take 1 tablet (200 mg total) by mouth 3 (three) times daily as needed for pain.    Dispense:  10 tablet    Refill:  0    Order Specific Question:   Supervising Provider    Answer:   Despina Hidden, LUTHER H [[id number]]    4. Screening examination for STD (sexually transmitted disease) - GC/Chlamydia Probe Amp on urine  Will talk when results back

## 2019-02-11 LAB — MICROSCOPIC EXAMINATION
Casts: NONE SEEN /lpf
RBC, Urine: >30 /hpf — AB (ref 0–2)

## 2019-02-11 LAB — URINALYSIS, ROUTINE W REFLEX MICROSCOPIC
Bilirubin, UA: NEGATIVE
Glucose, UA: NEGATIVE
Ketones, UA: NEGATIVE
Leukocytes,UA: NEGATIVE
Nitrite, UA: NEGATIVE
Protein,UA: NEGATIVE
Specific Gravity, UA: 1.012 (ref 1.005–1.030)
Urobilinogen, Ur: 0.2 mg/dL (ref 0.2–1.0)
pH, UA: 6 (ref 5.0–7.5)

## 2019-02-12 LAB — URINE CULTURE

## 2019-02-13 ENCOUNTER — Other Ambulatory Visit: Payer: Self-pay | Admitting: Adult Health

## 2019-02-13 ENCOUNTER — Telehealth: Payer: Self-pay | Admitting: Adult Health

## 2019-02-13 MED ORDER — AMPICILLIN 500 MG PO CAPS: 500.0000 mg | ORAL_CAPSULE | Freq: Four times a day (QID) | ORAL | 0 refills | Status: DC

## 2019-02-13 NOTE — Telephone Encounter (Signed)
Pt GBS in urine will rx ampicillin, she says she is feeling better

## 2019-02-18 LAB — GC/CHLAMYDIA PROBE AMP
Chlamydia trachomatis, NAA: NEGATIVE
Neisseria Gonorrhoeae by PCR: NEGATIVE

## 2019-11-07 ENCOUNTER — Ambulatory Visit (INDEPENDENT_AMBULATORY_CARE_PROVIDER_SITE_OTHER): Payer: 59 | Admitting: Urology

## 2019-11-07 ENCOUNTER — Encounter: Payer: Self-pay | Admitting: Urology

## 2019-11-07 ENCOUNTER — Other Ambulatory Visit: Payer: Self-pay

## 2019-11-07 VITALS — BP 106/65 | HR 75 | Temp 97.9°F | Ht 70.0 in | Wt 142.0 lb

## 2019-11-07 DIAGNOSIS — R339 Retention of urine, unspecified: Secondary | ICD-10-CM | POA: Diagnosis not present

## 2019-11-07 LAB — POCT URINALYSIS DIPSTICK
Blood, UA: NEGATIVE
Glucose, UA: NEGATIVE
Leukocytes, UA: NEGATIVE
Nitrite, UA: NEGATIVE
Protein, UA: POSITIVE — AB
Spec Grav, UA: >=1.03 — AB (ref 1.010–1.025)
Urobilinogen, UA: NEGATIVE E.U./dL — AB
pH, UA: 5 (ref 5.0–8.0)

## 2019-11-07 NOTE — Progress Notes (Signed)
H&P  Chief Complaint: Flank Pain   History of Present Illness: Anita George is a 39 y.o. year old female  3.2.2021: She presents today c/o chronic left-sided flank pain that has been fairly constant since 2007. This pain fluctuates in intensity periodically but is typically always present to some degree. She has been evaluated for this multiple times, but has only ever been treated with pain medication. She has had multiple negative renal US's in the past but her UA's very frequently have protein and blood. She does have a very strong family hx of stones and renal cysts. More recently, over the last 1-2 years, this pain has begun to radiate to her groin and lower abdomen. Negative evaluation per gynecologist on multiple exams and studies -- she was told she may have endometriosis but she declined to start medication for this due to fear of their side effects. Based on her own research, she thinks this could be due to "painful bladder syndrome," and has since stopped consuming caffeine, tomatoes, and other bladder irritants, noting some improvement to sx's but not much. Her pain has been worse with urge to have BM and is alleviated following completion of BM. She has had increased urinary freq/urgency and has had to strain a lot more to initiate her stream. In the last 5 months this pain is at its worst in the morning immediately after waking up. She has never tried PT for this pain. Additionally, in the last 4 days she had developed a new stabbing pain in the right flank with no alleviating factors.   Past Medical History:  Diagnosis Date  . Anxiety   . Arnold-Chiari malformation (HCC)   . AS (ankylosing spondylitis) (HCC)   . Depression   . Fatigue   . Fibromyalgia   . GERD (gastroesophageal reflux disease)   . Headache   . Hiatal hernia   . Juvenile rheumatoid arthritis (HCC)   . Lupus (systemic lupus erythematosus) (HCC)    mixed connective tissue disease per pt  . Mixed connective  tissue disease (HCC)   . Palpitations   . Pericardial effusion   . Shortness of breath   . Ventricular tachycardia (HCC)    nonsutained by cardiac monitor November 21, 2008 no other arrhythmias and negative workup for structural heart diseas e(normal echo)    Past Surgical History:  Procedure Laterality Date  . NO PAST SURGERIES    . UPPER GASTROINTESTINAL ENDOSCOPY  2014   Dr.Benson @ [facility]    Home Medications:  (Not in a hospital admission)   Allergies:  Allergies  Allergen Reactions  . Cephalosporins     REACTION: rash    Family History  Problem Relation Age of Onset  . Depression Mother   . Diabetes Mother   . Heart disease Mother   . Hiatal hernia Mother   . Hypertension Mother   . Fibromyalgia Mother   . Depression Father   . Diabetes Father   . Heart disease Father   . Hypertension Father   . Depression Sister   . Fibromyalgia Sister   . Depression Brother   . Drug abuse Brother   . Alcohol abuse Brother   . Diabetes Brother   . Stroke Other   . Hypertension Other   . Hyperlipidemia Other   . Stroke Maternal Grandmother     Social History:  reports that she quit smoking about 11 years ago. Her smoking use included cigarettes. She has a 22.00 pack-year smoking history. She has never  used smokeless tobacco. She reports current alcohol use. She reports that she does not use drugs.  ROS: A complete review of systems was performed.  All systems are negative except for pertinent findings as noted.  Physical Exam:  Vital signs in last 24 hours: Temp:  [97.9 F (36.6 C)] 97.9 F (36.6 C) (03/02 1346) Pulse Rate:  [75] 75 (03/02 1346) BP: (106)/(65) 106/65 (03/02 1346) Weight:  [142 lb (64.4 kg)] 142 lb (64.4 kg) (03/02 1346) General:  Alert and oriented, No acute distress HEENT: Normocephalic, atraumatic Neck: No JVD or lymphadenopathy Cardiovascular: Regular rate and rhythm Lungs: Clear bilaterally Abdomen: Soft, generalized abdominal, nondistended, no  abdominal masses Back: Bilateral CVA tenderness, Paraspinal muscle tenderness Extremities: No edema Neurologic: Grossly intact Pt refused pelvic exam  Laboratory Data:  Results for orders placed or performed in visit on 11/07/19 (from the past 24 hour(s))  POCT urinalysis dipstick     Status: Abnormal   Collection Time: 11/07/19  2:08 PM  Result Value Ref Range   Color, UA yellow    Clarity, UA clear    Glucose, UA Negative Negative   Bilirubin, UA moderate    Ketones, UA large    Spec Grav, UA >=1.030 (A) 1.010 - 1.025   Blood, UA neg    pH, UA 5.0 5.0 - 8.0   Protein, UA Positive (A) Negative   Urobilinogen, UA negative (A) 0.2 or 1.0 E.U./dL   Nitrite, UA neg    Leukocytes, UA Negative Negative   Appearance clear    Odor     I have reviewed prior pt notes  I have reviewed notes from referring/previous physicians  I have reviewed urinalysis results  I have independently reviewed prior imaging    Impression/Assessment:  I do not think that it is likely that this flank/back pain is secondary to kidney stones. On exam, she has very generalized, non-specific abdominal and CVA tenderness. She declined GU exam today -- she did not feel as though this was necessary as she primarily wanted her kidneys examined and not her bladder/pelvic sx's. We did discuss the utility of this exam for evaluation and possible treatment of her sx's but she affirmed that she would not like this done today. I did provide some information on OAB management -- this may help alleviate some of her urinary sx's.   She believes that her pain could be secondary to her auto-immune and connective tissue disorders -- without proper exam it is hard to say whether or not this is likely the complete etiology.   CT from March 2018 showed no evidence of stones with a normal appearing bladder. 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   Plan:  1. She was assured that her pain does not seem consistent  with kidney or bladder dysfunction  2. Information provided on OAB management for her to try to employ for alleviating some of her urinary sx's.  3. I recommended that she look into some PT for her pelvic floor sx's -- she should discuss this further with her gynecologist.   4. Return PRN  Dena Billet 11/07/2019, 2:43 PM  Lillette Boxer. Abdulmalik Darco MD

## 2019-11-07 NOTE — Progress Notes (Signed)
Urological Symptom Review  Patient is experiencing the following symptoms: Frequent urination Hard to postpone urination Burning/pain with urination Leakage of urine Stream starts and stops Trouble starting stream Have to strain to urinate Painful intercourse Weak stream   Review of Systems  Gastrointestinal (upper)  : Nausea Indigestion/heartburn  Gastrointestinal (lower) : Negative for lower GI symptoms  Constitutional : Fever Fatigue Skin: Skin rash/lesion  Eyes: Blurred vision  Ear/Nose/Throat : Sinus problems  Hematologic/Lymphatic: Easy bruising  Cardiovascular : Leg swelling Chest pain  Respiratory : Shortness of breath  Endocrine: Negative for endocrine symptoms  Musculoskeletal: Back pain Joint pain  Neurological: Headaches Dizziness  Psychologic: Anxiety

## 2020-01-23 DIAGNOSIS — Z029 Encounter for administrative examinations, unspecified: Secondary | ICD-10-CM

## 2020-12-05 ENCOUNTER — Other Ambulatory Visit: Payer: Self-pay | Admitting: Neurosurgery

## 2020-12-05 DIAGNOSIS — M546 Pain in thoracic spine: Secondary | ICD-10-CM

## 2020-12-05 DIAGNOSIS — M542 Cervicalgia: Secondary | ICD-10-CM

## 2020-12-05 DIAGNOSIS — M47816 Spondylosis without myelopathy or radiculopathy, lumbar region: Secondary | ICD-10-CM

## 2021-02-04 ENCOUNTER — Ambulatory Visit
Admission: RE | Admit: 2021-02-04 | Discharge: 2021-02-04 | Disposition: A | Discharge status: Home / Self Care | Payer: 59 | Source: Ambulatory Visit | Attending: Neurosurgery | Admitting: Neurosurgery

## 2021-02-04 ENCOUNTER — Other Ambulatory Visit: Payer: Self-pay

## 2021-02-04 DIAGNOSIS — M542 Cervicalgia: Secondary | ICD-10-CM

## 2021-02-04 DIAGNOSIS — M546 Pain in thoracic spine: Secondary | ICD-10-CM

## 2021-02-04 DIAGNOSIS — M47816 Spondylosis without myelopathy or radiculopathy, lumbar region: Secondary | ICD-10-CM

## 2021-02-04 IMAGING — MR MR LUMBAR SPINE W/O CM
5 series · 32 of 48 positions shown · non-contrast
Comparison: [DATE]

CLINICAL DATA: Cervicalgia, headaches, history of Chiari
malformation, mid and lower back pain

EXAM:
MRI CERVICAL, THORACIC AND LUMBAR SPINE WITHOUT CONTRAST
TECHNIQUE: Multiplanar and multiecho pulse sequences of the cervical spine, to
include the craniocervical junction and cervicothoracic junction,
and thoracic and lumbar spine, were obtained without intravenous
contrast.

[Series 3: T2 · sagittal · 4.0mm · 1.09mm/px · 6 of 17 slices shown (1 of 2)]
[im 1/17]
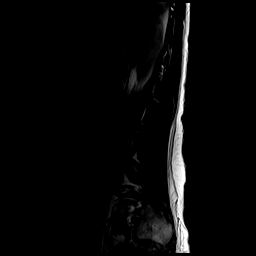
[im 4/17]
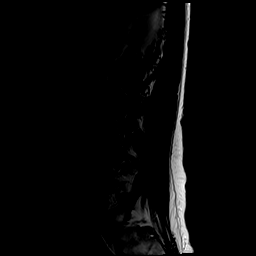
[im 7/17]
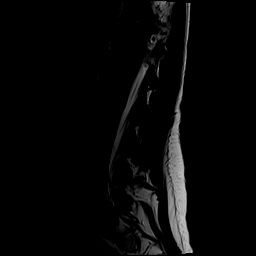
[im 10/17]
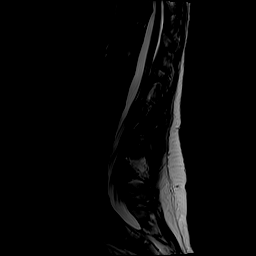
[im 13/17]
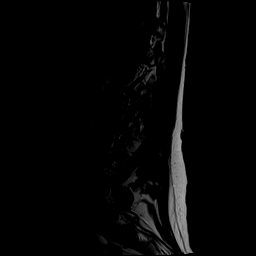
[im 17/17]
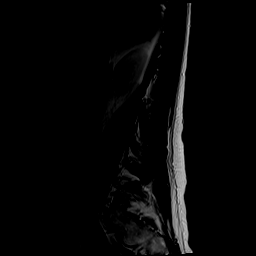

[Series 4: tirm sag · sagittal · 4.0mm · 1.09mm/px · 2 of 17 slices shown]
[im 1/17]
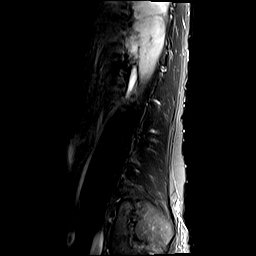
[im 4/17]
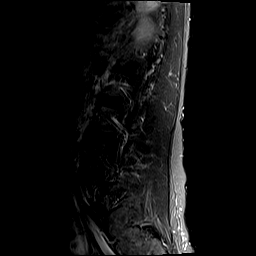

[Series 5: T1 · sagittal · 4.0mm · 1.09mm/px · 6 of 17 slices shown (1 of 2)]
[im 1/17]
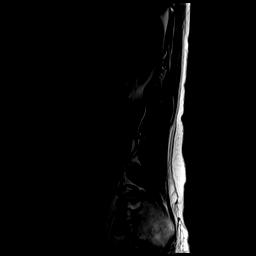
[im 4/17]
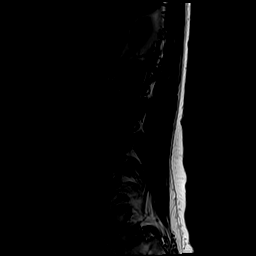
[im 7/17]
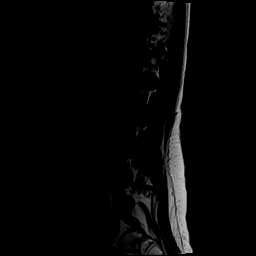
[im 10/17]
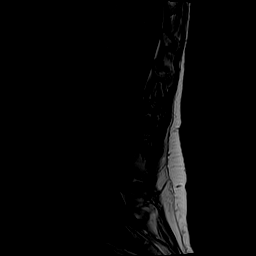
[im 13/17]
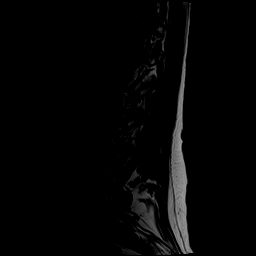
[im 17/17]
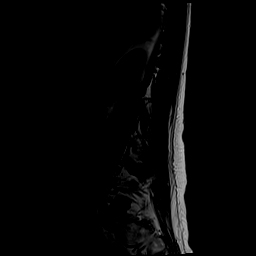

[Series 8: T2 · axial · 4.0mm · 0.39mm/px · z∈[-130,+99]mm · 9 of 45 slices shown (2 of 2)]
[im 1/45]
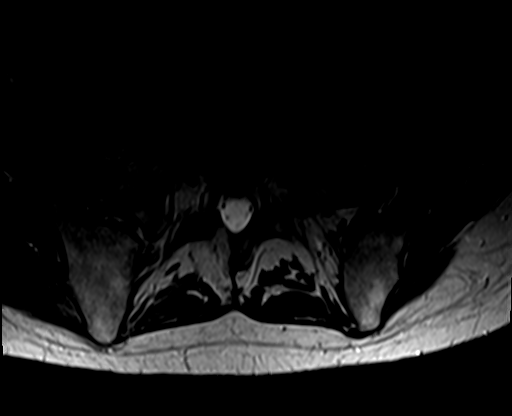
[im 7/45]
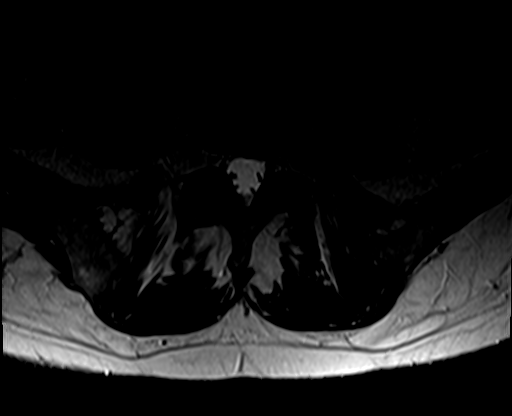
[im 13/45]
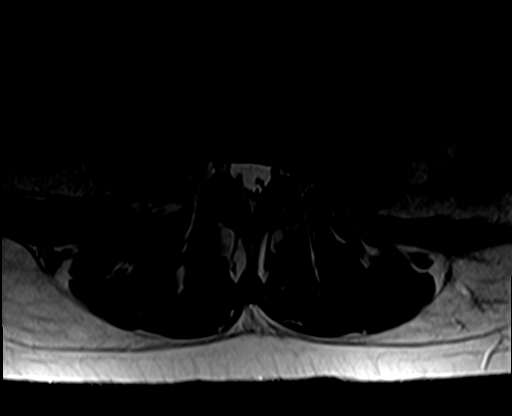
[im 19/45]
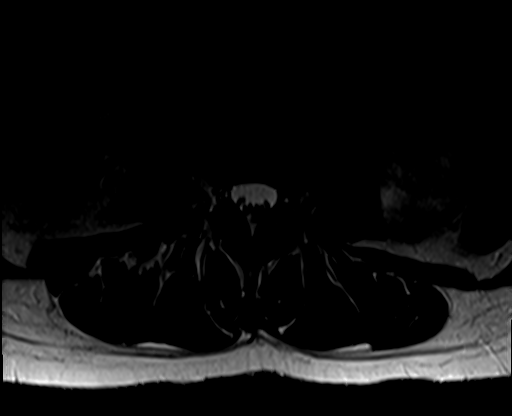
[im 23/45]
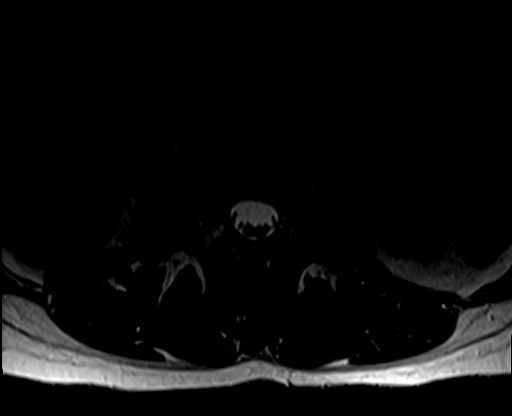
[im 26/45]
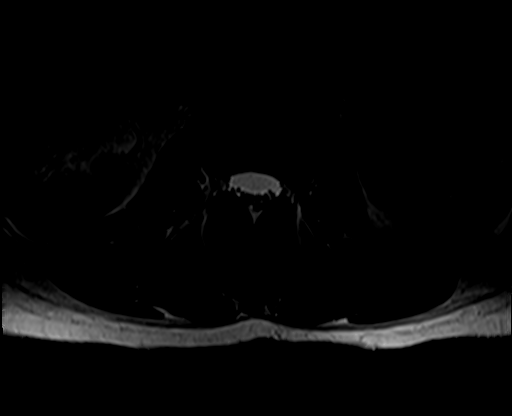
[im 32/45]
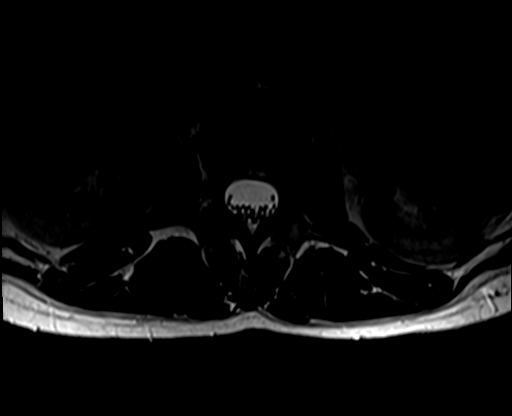
[im 38/45]
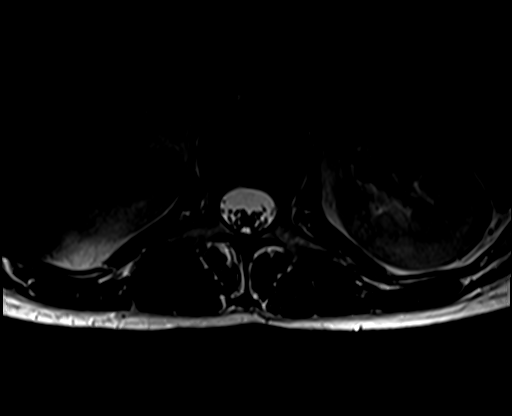
[im 45/45]
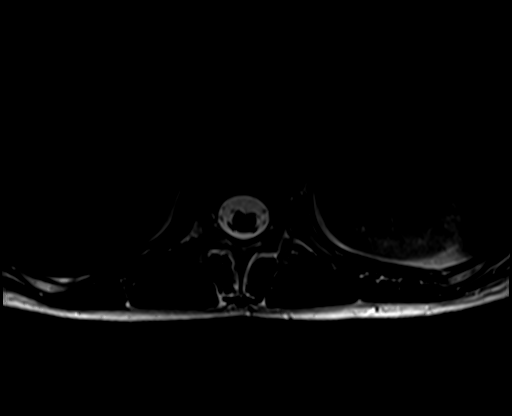

[Series 9: T1 · axial · 4.0mm · 0.39mm/px · z∈[-130,+99]mm · 9 of 45 slices shown (2 of 2)]
[im 1/45]
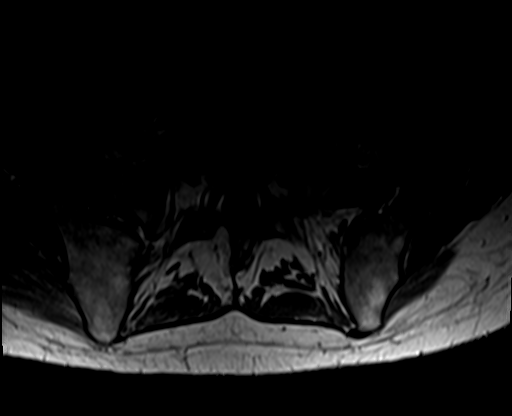
[im 7/45]
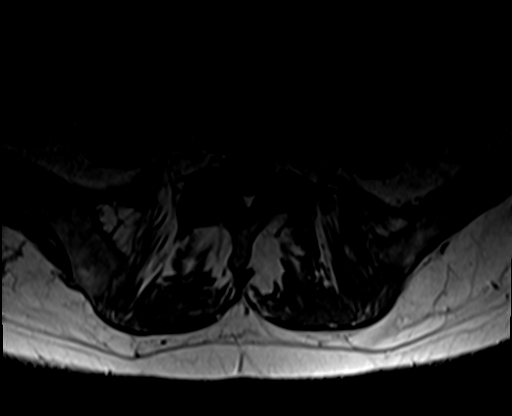
[im 13/45]
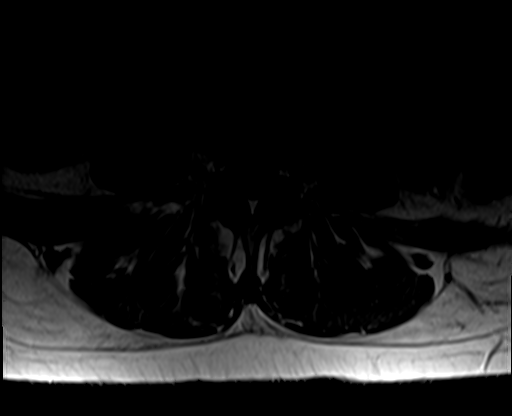
[im 19/45]
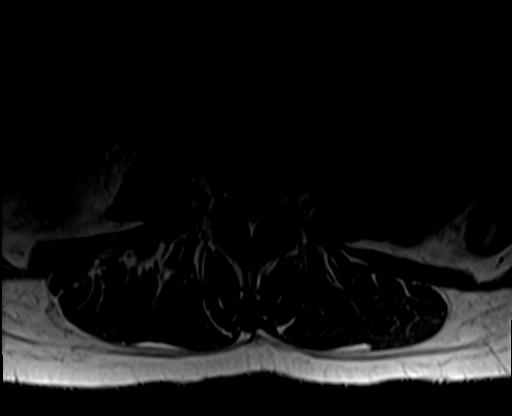
[im 23/45]
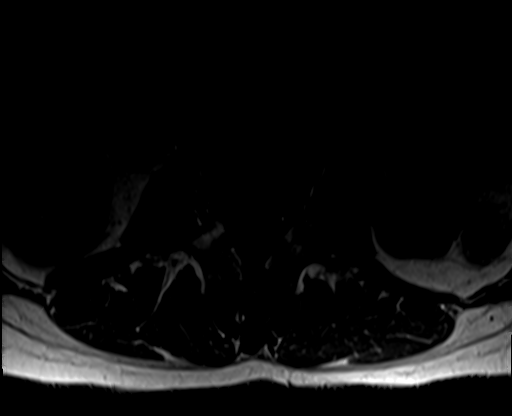
[im 26/45]
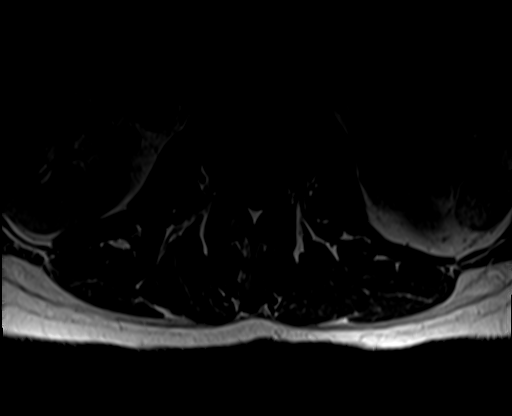
[im 32/45]
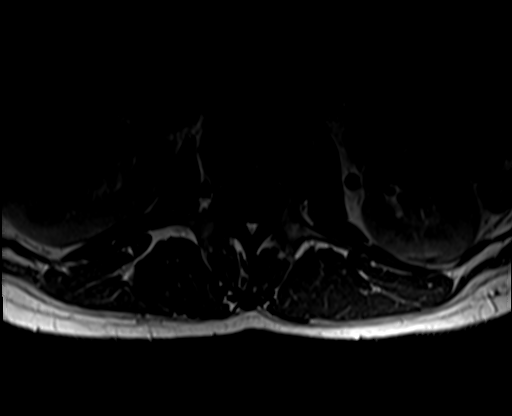
[im 38/45]
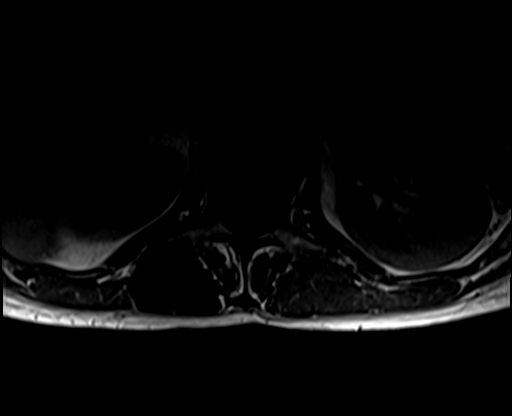
[im 45/45]
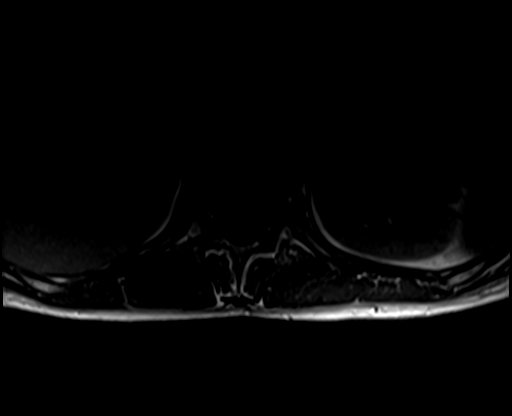

[32 of 48 positions shown; findings below may reference images not displayed]

FINDINGS: MRI CERVICAL SPINE

Alignment: No significant listhesis.

Vertebrae: Vertebral body heights are maintained. Normal marrow
signal is preserved.

Cord: Normal caliber and signal.

Posterior Fossa, vertebral arteries, paraspinal tissues: Chiari 1
malformation is again identified with crowding at the foramen
magnum. Otherwise unremarkable.

Disc levels: Trace disc bulges at C4-C5. Shallow left paracentral
disc protrusion with tiny endplate osteophytes at C5-C6. Shallow
central protrusion at C6-C7. No stenosis.

MRI THORACIC SPINE

Alignment:  No significant listhesis.

Vertebrae: Vertebral body heights are maintained. No marrow edema.
No suspicious osseous lesion.

Cord:  Normal caliber and signal.

Paraspinal and other soft tissues: Unremarkable.

Disc levels: Stable minor degenerative disc disease at T11-T12 with
trace disc bulge. No stenosis.

MRI LUMBAR SPINE

Segmentation:  Standard.

Alignment:  Anteroposterior alignment is maintained.

Vertebrae: Vertebral body heights are preserved. No marrow edema. No
suspicious osseous lesion.

Conus medullaris and cauda equina: Conus extends to the L1-L2 level.
Conus and cauda equina appear normal.

Paraspinal and other soft tissues: Unremarkable.

Disc levels: Intervertebral disc heights and signal are maintained.
Similar mild facet arthropathy at L5-S1. There is no stenosis.
IMPRESSION: No significant change since [0S] examination. Minor degenerative
changes are present without stenosis. Mild facet arthropathy at
L5-S1.

Chiari 1 malformation.  Normal cord signal.

## 2021-02-04 IMAGING — MR MR THORACIC SPINE W/O CM
4 of 6 series · 17 of 48 positions shown · non-contrast
Comparison: [DATE]

CLINICAL DATA: Cervicalgia, headaches, history of Chiari
malformation, mid and lower back pain

EXAM:
MRI CERVICAL, THORACIC AND LUMBAR SPINE WITHOUT CONTRAST
TECHNIQUE: Multiplanar and multiecho pulse sequences of the cervical spine, to
include the craniocervical junction and cervicothoracic junction,
and thoracic and lumbar spine, were obtained without intravenous
contrast.

[Series 5: T2 · sagittal · 4.0mm · 0.52mm/px · 6 of 17 slices shown (1 of 3)]
[im 1/17]
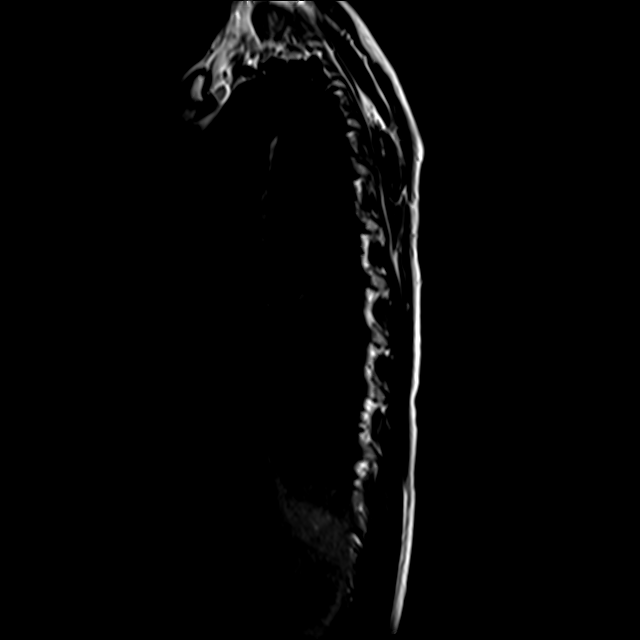
[im 4/17]
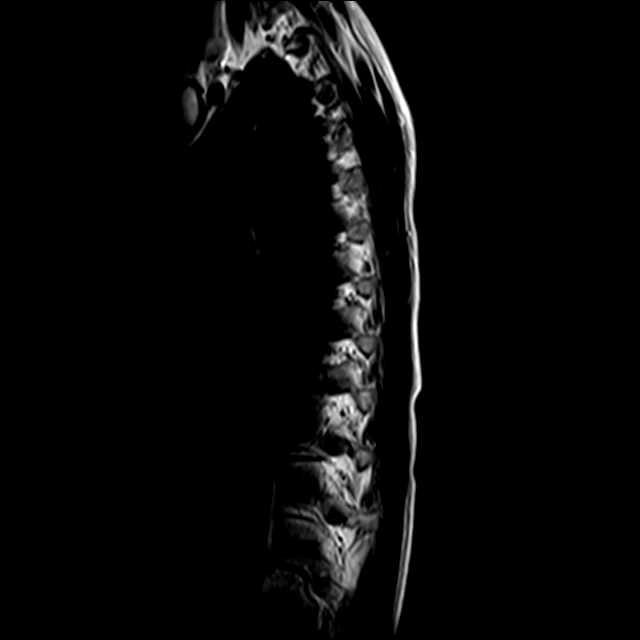
[im 7/17]
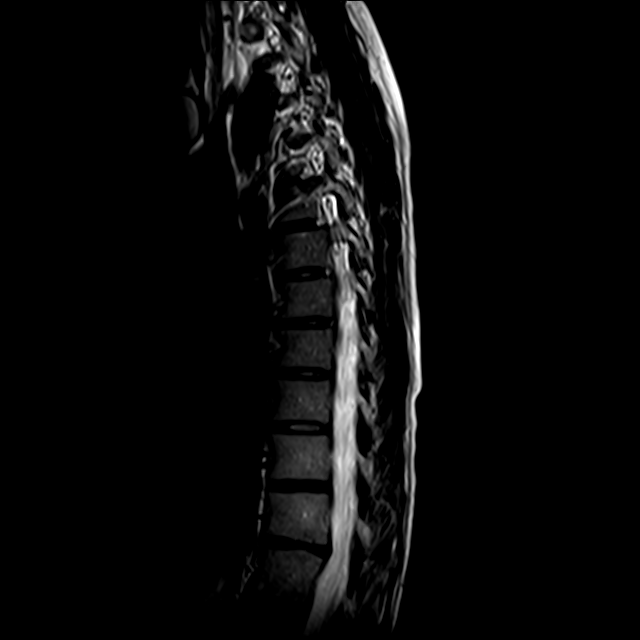
[im 10/17]
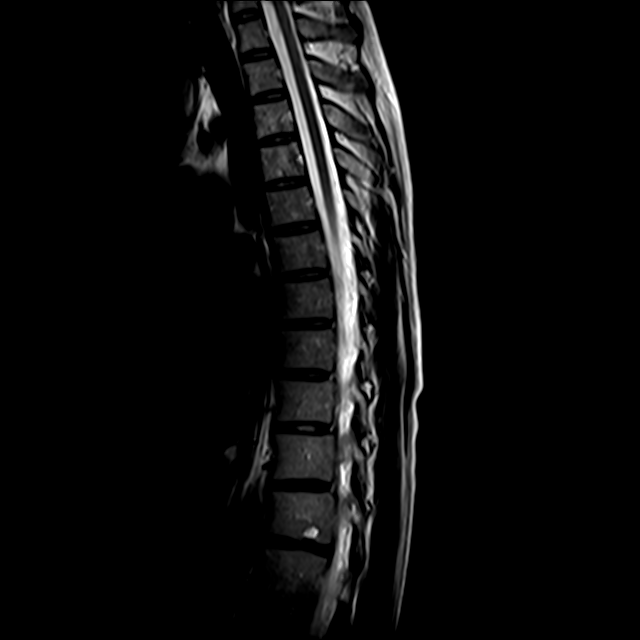
[im 13/17]
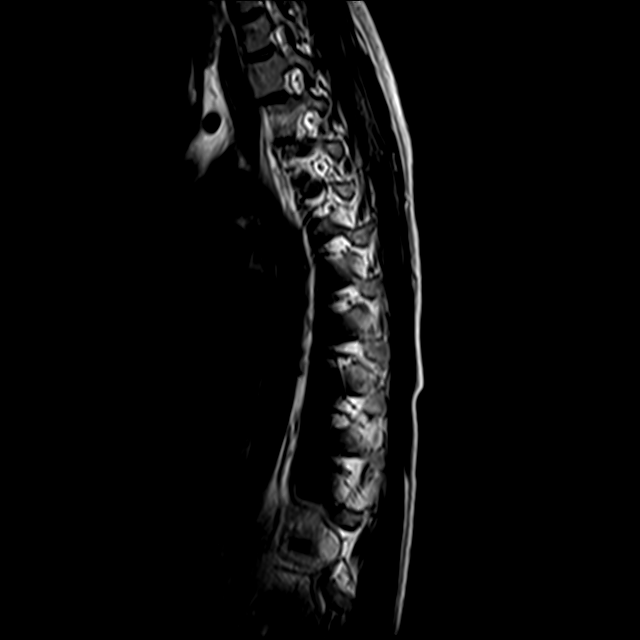
[im 17/17]
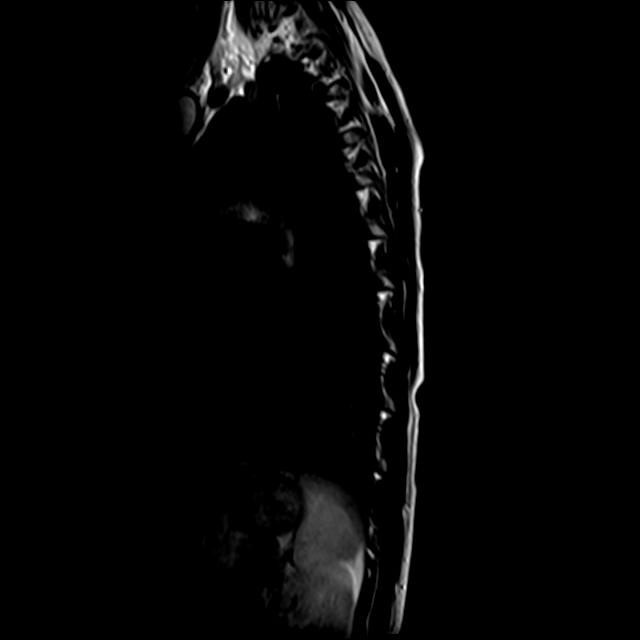

[Series 7: T1 · sagittal · 4.0mm · 1.03mm/px · 3 of 17 slices shown]
[im 4/17]
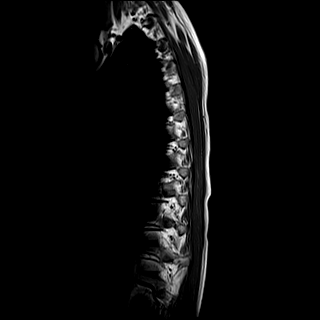
[im 10/17]
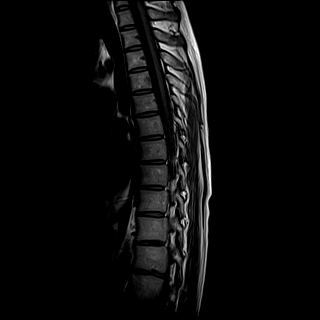
[im 17/17]
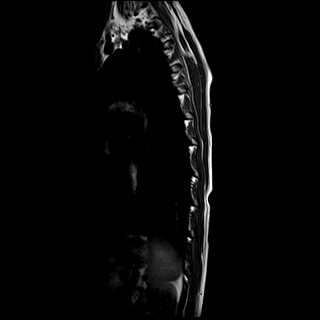

[Series 8: T2 · axial · 4.0mm · 0.39mm/px · z∈[-357,-126]mm · 5 of 36 slices shown (2 of 3)]
[im 1/36]
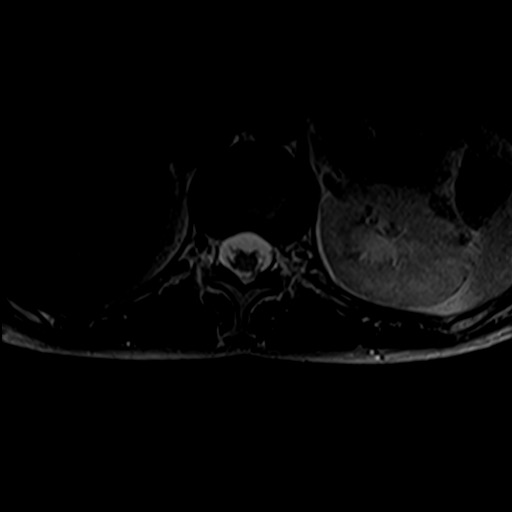
[im 7/36]
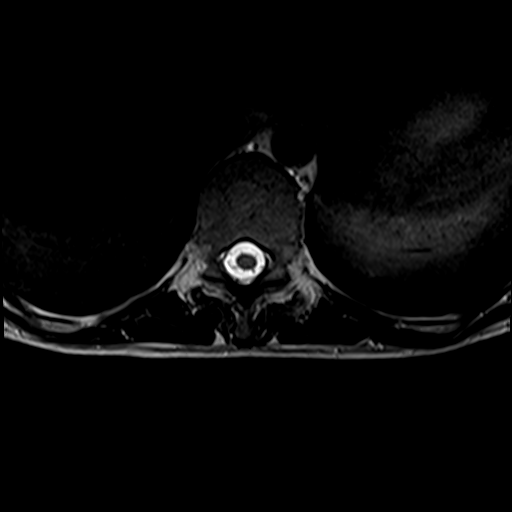
[im 10/36]
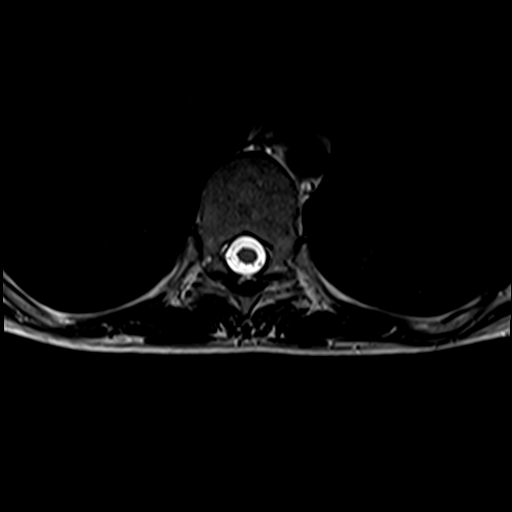
[im 20/36]
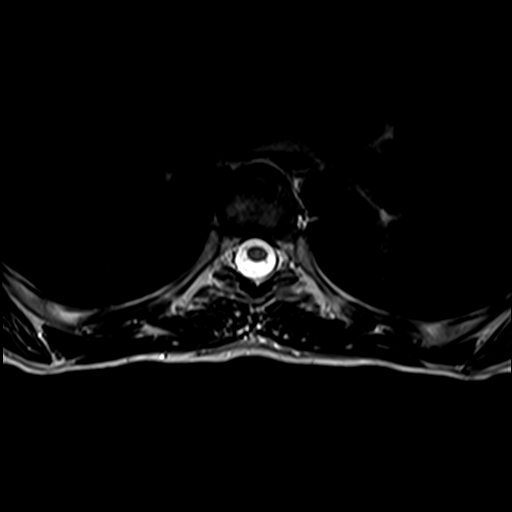
[im 32/36]
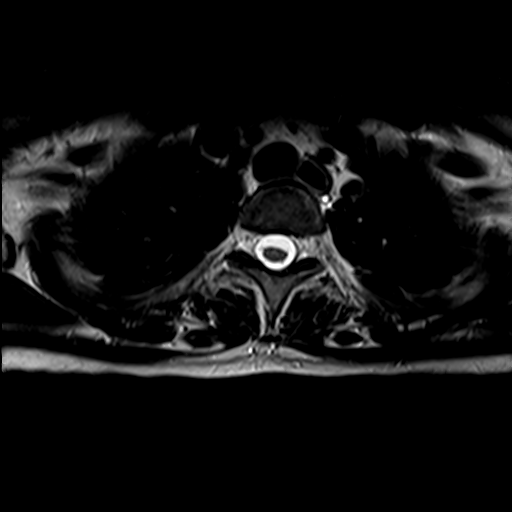

[Series 9: T2 · axial · 4.0mm · 0.39mm/px · z∈[-304,-126]mm · 3 of 36 slices shown (3 of 3)]
[im 7/36]
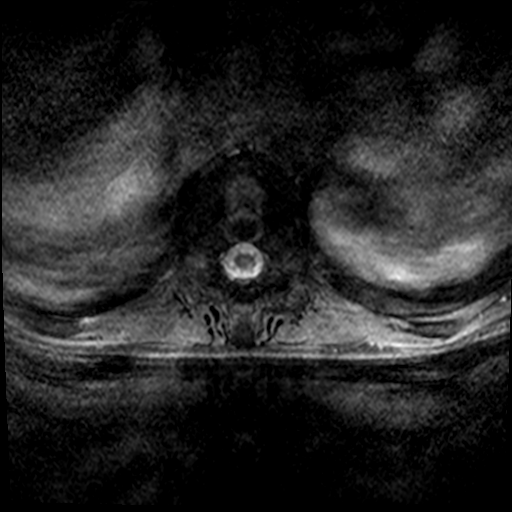
[im 20/36]
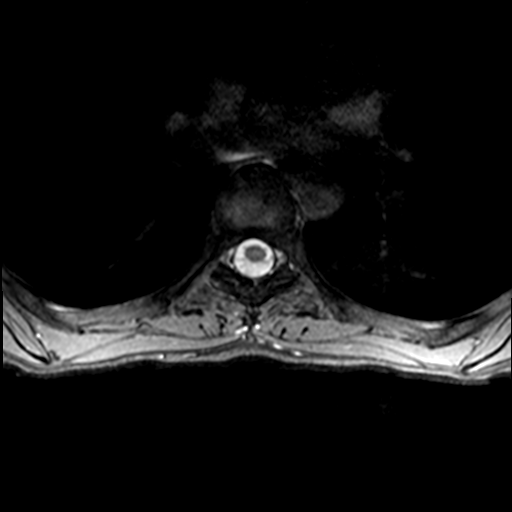
[im 32/36]
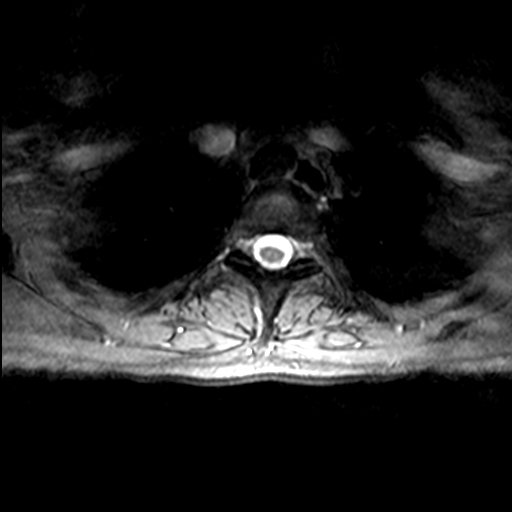

[17 of 48 positions shown; findings below may reference images not displayed]

FINDINGS: MRI CERVICAL SPINE

Alignment: No significant listhesis.

Vertebrae: Vertebral body heights are maintained. Normal marrow
signal is preserved.

Cord: Normal caliber and signal.

Posterior Fossa, vertebral arteries, paraspinal tissues: Chiari 1
malformation is again identified with crowding at the foramen
magnum. Otherwise unremarkable.

Disc levels: Trace disc bulges at C4-C5. Shallow left paracentral
disc protrusion with tiny endplate osteophytes at C5-C6. Shallow
central protrusion at C6-C7. No stenosis.

MRI THORACIC SPINE

Alignment:  No significant listhesis.

Vertebrae: Vertebral body heights are maintained. No marrow edema.
No suspicious osseous lesion.

Cord:  Normal caliber and signal.

Paraspinal and other soft tissues: Unremarkable.

Disc levels: Stable minor degenerative disc disease at T11-T12 with
trace disc bulge. No stenosis.

MRI LUMBAR SPINE

Segmentation:  Standard.

Alignment:  Anteroposterior alignment is maintained.

Vertebrae: Vertebral body heights are preserved. No marrow edema. No
suspicious osseous lesion.

Conus medullaris and cauda equina: Conus extends to the L1-L2 level.
Conus and cauda equina appear normal.

Paraspinal and other soft tissues: Unremarkable.

Disc levels: Intervertebral disc heights and signal are maintained.
Similar mild facet arthropathy at L5-S1. There is no stenosis.
IMPRESSION: No significant change since [0S] examination. Minor degenerative
changes are present without stenosis. Mild facet arthropathy at
L5-S1.

Chiari 1 malformation.  Normal cord signal.

## 2021-02-04 IMAGING — MR MR CERVICAL SPINE W/O CM
5 series · 41 of 48 positions shown · non-contrast
Comparison: [DATE]

CLINICAL DATA: Cervicalgia, headaches, history of Chiari
malformation, mid and lower back pain

EXAM:
MRI CERVICAL, THORACIC AND LUMBAR SPINE WITHOUT CONTRAST
TECHNIQUE: Multiplanar and multiecho pulse sequences of the cervical spine, to
include the craniocervical junction and cervicothoracic junction,
and thoracic and lumbar spine, were obtained without intravenous
contrast.

[Series 2: T2 · sagittal · 3.0mm · 0.39mm/px · 6 of 17 slices shown (1 of 2)]
[im 1/17]
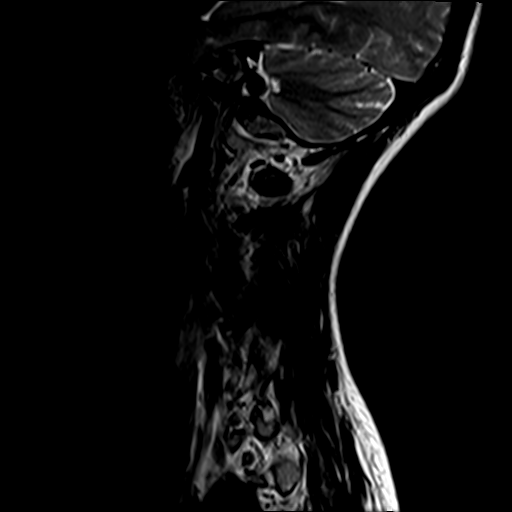
[im 4/17]
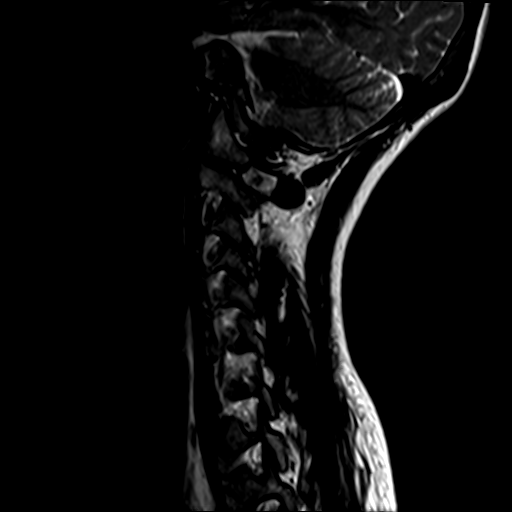
[im 7/17]
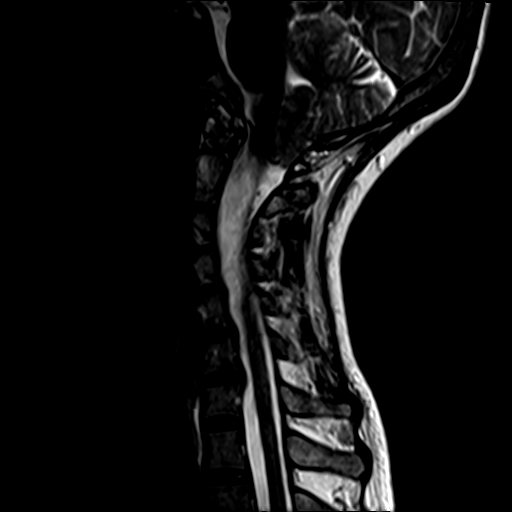
[im 10/17]
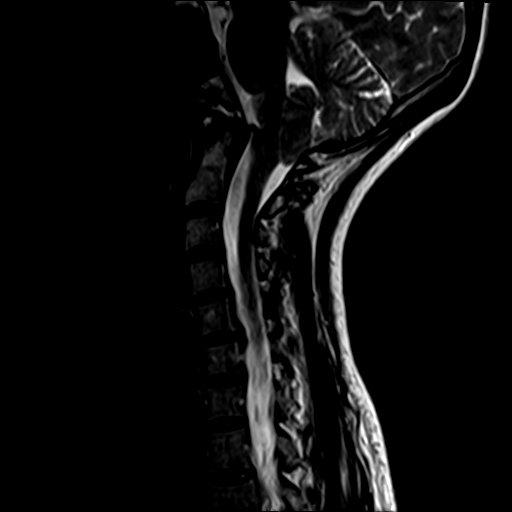
[im 13/17]
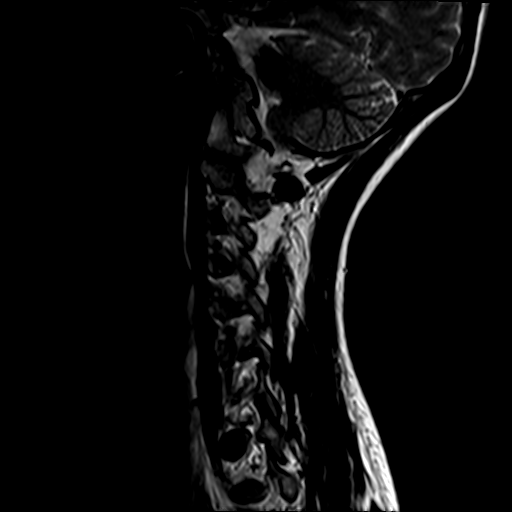
[im 17/17]
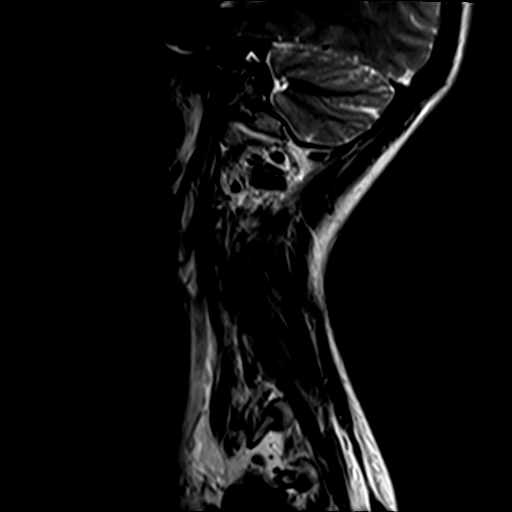

[Series 3: STIR · sagittal · 3.0mm · 0.78mm/px · 7 of 17 slices shown]
[im 1/17]
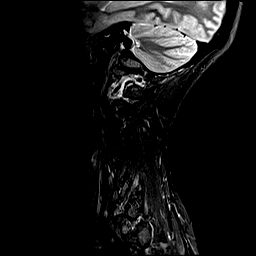
[im 3/17]
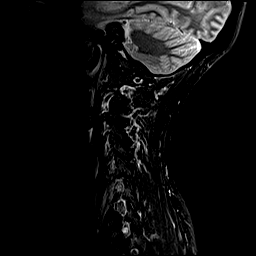
[im 6/17]
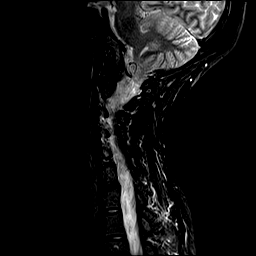
[im 9/17]
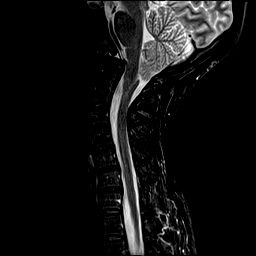
[im 11/17]
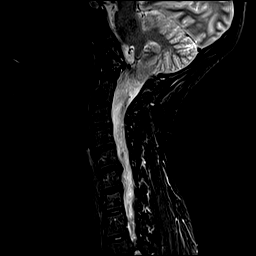
[im 14/17]
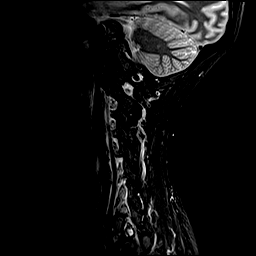
[im 17/17]
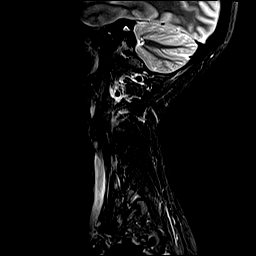

[Series 4: T1 · sagittal · 3.0mm · 0.78mm/px · 7 of 17 slices shown]
[im 1/17]
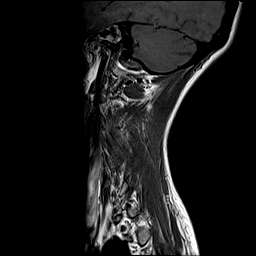
[im 3/17]
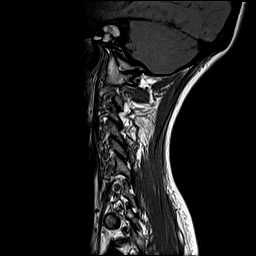
[im 6/17]
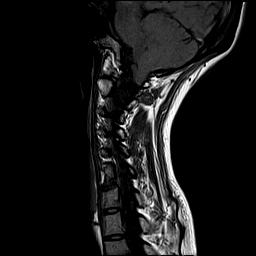
[im 9/17]
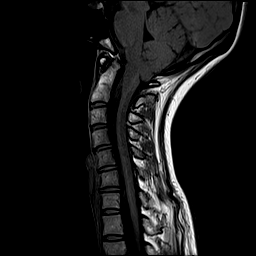
[im 11/17]
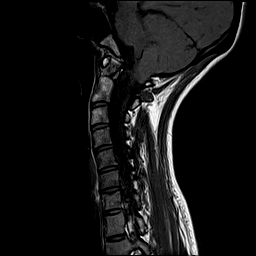
[im 14/17]
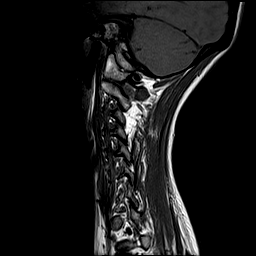
[im 17/17]
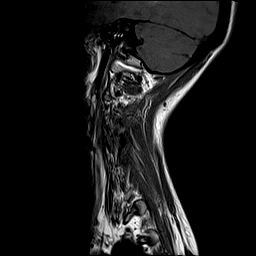

[Series 5: T2 · axial · 3.0mm · 0.70mm/px · z∈[-3,+117]mm · 13 of 33 slices shown (2 of 2)]
[im 1/33]
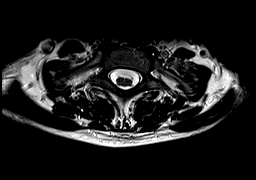
[im 3/33]
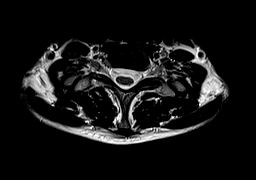
[im 5/33]
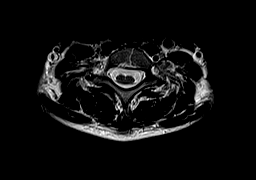
[im 8/33]
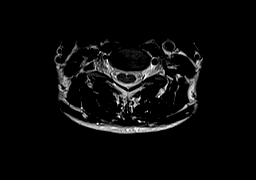
[im 10/33]
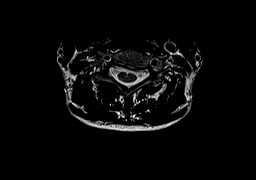
[im 13/33]
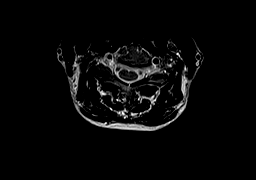
[im 15/33]
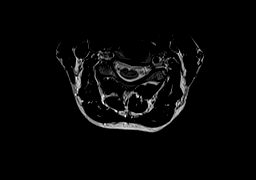
[im 18/33]
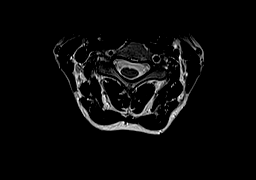
[im 20/33]
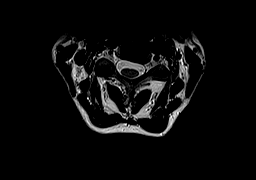
[im 23/33]
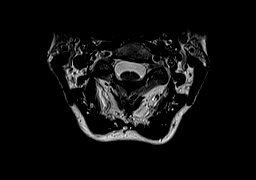
[im 25/33]
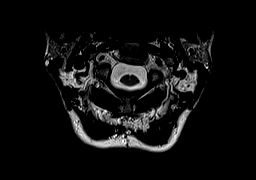
[im 28/33]
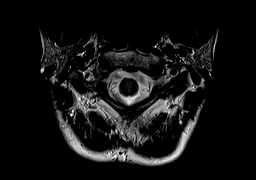
[im 33/33]
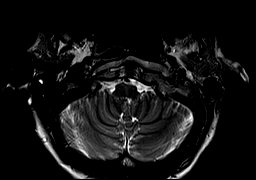

[Series 6: GRE · axial · 3.0mm · 0.35mm/px · z∈[-3,+117]mm · 8 of 33 slices shown]
[im 1/33]
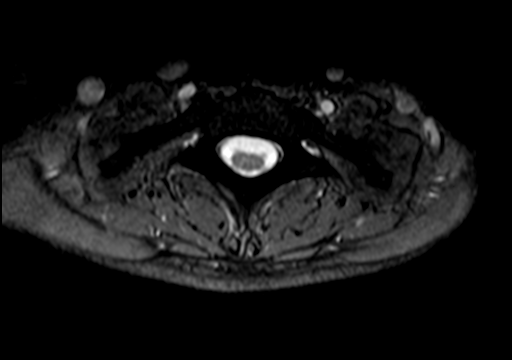
[im 5/33]
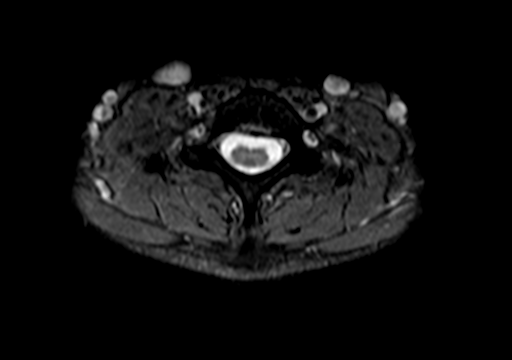
[im 10/33]
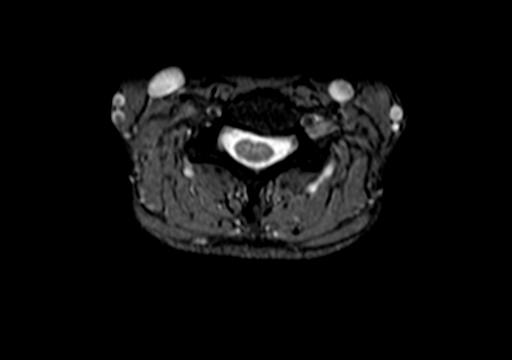
[im 15/33]
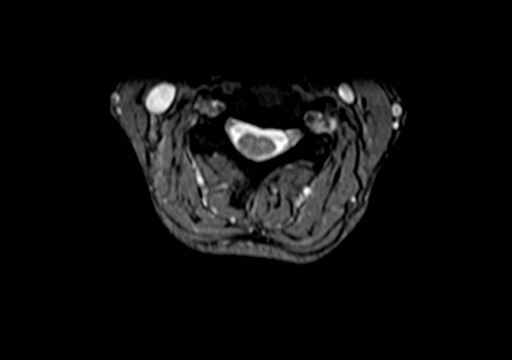
[im 18/33]
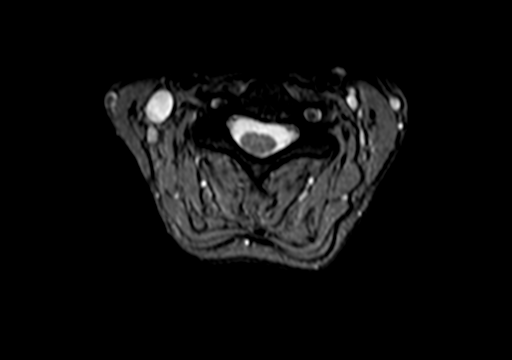
[im 23/33]
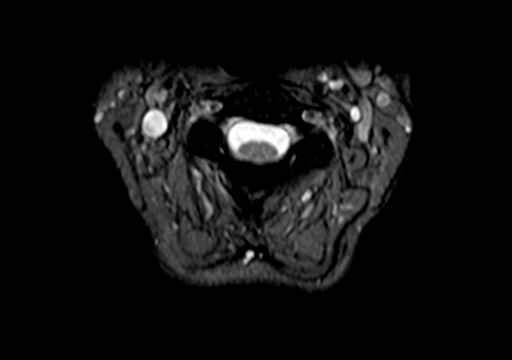
[im 28/33]
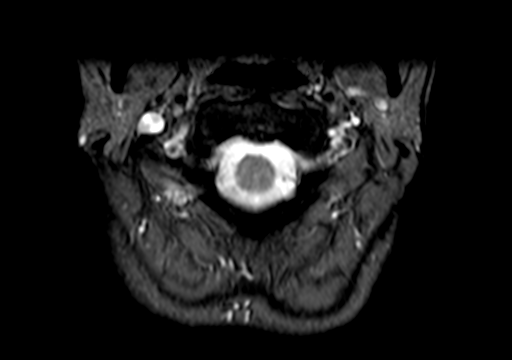
[im 33/33]
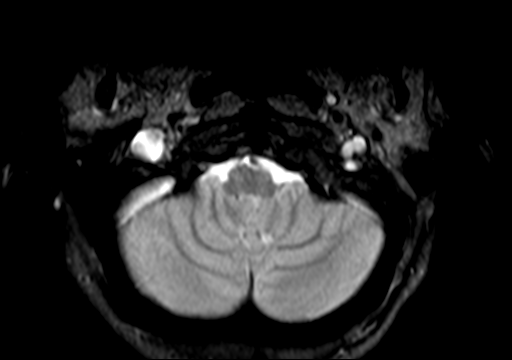

[41 of 48 positions shown; findings below may reference images not displayed]

FINDINGS: MRI CERVICAL SPINE

Alignment: No significant listhesis.

Vertebrae: Vertebral body heights are maintained. Normal marrow
signal is preserved.

Cord: Normal caliber and signal.

Posterior Fossa, vertebral arteries, paraspinal tissues: Chiari 1
malformation is again identified with crowding at the foramen
magnum. Otherwise unremarkable.

Disc levels: Trace disc bulges at C4-C5. Shallow left paracentral
disc protrusion with tiny endplate osteophytes at C5-C6. Shallow
central protrusion at C6-C7. No stenosis.

MRI THORACIC SPINE

Alignment:  No significant listhesis.

Vertebrae: Vertebral body heights are maintained. No marrow edema.
No suspicious osseous lesion.

Cord:  Normal caliber and signal.

Paraspinal and other soft tissues: Unremarkable.

Disc levels: Stable minor degenerative disc disease at T11-T12 with
trace disc bulge. No stenosis.

MRI LUMBAR SPINE

Segmentation:  Standard.

Alignment:  Anteroposterior alignment is maintained.

Vertebrae: Vertebral body heights are preserved. No marrow edema. No
suspicious osseous lesion.

Conus medullaris and cauda equina: Conus extends to the L1-L2 level.
Conus and cauda equina appear normal.

Paraspinal and other soft tissues: Unremarkable.

Disc levels: Intervertebral disc heights and signal are maintained.
Similar mild facet arthropathy at L5-S1. There is no stenosis.
IMPRESSION: No significant change since [0S] examination. Minor degenerative
changes are present without stenosis. Mild facet arthropathy at
L5-S1.

Chiari 1 malformation.  Normal cord signal.

## 2021-03-06 ENCOUNTER — Ambulatory Visit: Payer: 59 | Admitting: Adult Health

## 2021-04-14 ENCOUNTER — Ambulatory Visit (INDEPENDENT_AMBULATORY_CARE_PROVIDER_SITE_OTHER): Payer: 59 | Admitting: Adult Health

## 2021-04-14 ENCOUNTER — Other Ambulatory Visit (HOSPITAL_COMMUNITY)
Admission: RE | Admit: 2021-04-14 | Discharge: 2021-04-14 | Disposition: A | Discharge status: Home / Self Care | Payer: 59 | Source: Ambulatory Visit | Attending: Adult Health | Admitting: Adult Health

## 2021-04-14 ENCOUNTER — Encounter: Payer: Self-pay | Admitting: Adult Health

## 2021-04-14 ENCOUNTER — Other Ambulatory Visit: Payer: Self-pay

## 2021-04-14 VITALS — BP 106/67 | HR 60 | Ht 70.0 in | Wt 135.5 lb

## 2021-04-14 DIAGNOSIS — R350 Frequency of micturition: Secondary | ICD-10-CM

## 2021-04-14 DIAGNOSIS — R102 Pelvic and perineal pain: Secondary | ICD-10-CM

## 2021-04-14 DIAGNOSIS — F419 Anxiety disorder, unspecified: Secondary | ICD-10-CM

## 2021-04-14 DIAGNOSIS — G8929 Other chronic pain: Secondary | ICD-10-CM

## 2021-04-14 DIAGNOSIS — Z01419 Encounter for gynecological examination (general) (routine) without abnormal findings: Secondary | ICD-10-CM | POA: Diagnosis present

## 2021-04-14 DIAGNOSIS — F32A Depression, unspecified: Secondary | ICD-10-CM

## 2021-04-14 LAB — POCT URINALYSIS DIPSTICK
Blood, UA: NEGATIVE
Glucose, UA: NEGATIVE
Ketones, UA: NEGATIVE
Leukocytes, UA: NEGATIVE
Nitrite, UA: NEGATIVE
Protein, UA: NEGATIVE

## 2021-04-14 NOTE — Progress Notes (Signed)
Patient ID: Anita George, female   DOB: 1980/12/06, 40 y.o.   MRN: [id number] History of Present Illness: Anita George is a 40 year old white female, married, G1P0010, in for well woman gyn exam and pap. She is complaining of urinary frequency and has chronic pelvic pain, stabbing LLQ and hurts with BM, she has seem 2 GIs and also 2 different urologists. 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  She has lost weight she says and has lots of nausea and has headaches, she has seen rheumatology too for RA and ?lupus, she has Chiari malformation 1. PCP is [facility].    Current Medications, Allergies, Past Medical History, Past Surgical History, Family History and Social History were reviewed in [employer] record.     Review of Systems: Patient denies any  hearing loss, fatigue, blurred vision, shortness of breath, chest pain, problems with intercourse. No joint pain or mood swings.  See HPI for positives.    Physical Exam:BP 106/67 (BP Location: Left Arm, Patient Position: Sitting, Cuff Size: Normal)   Pulse 60   Ht 5\' 10"  (1.778 m)   Wt 135 lb 8 oz (61.5 kg)   LMP 03/27/2021   BMI 19.44 kg/m  urine dipstick is negative. General:  Well developed, well nourished, no acute distress Skin:  Warm and dry Neck:  Midline trachea, normal thyroid, good ROM, no lymphadenopathy Lungs; Clear to auscultation bilaterally Breast:  No dominant palpable mass, retraction, or nipple discharge Cardiovascular: Regular rate and rhythm Abdomen:  Soft, non tender, no hepatosplenomegaly Pelvic:  External genitalia is normal in appearance, no lesions.  The vagina is normal in appearance. Urethra has no lesions or masses. The cervix is smooth, pap with GC/CHL and HR HPV genotyping performed.  Uterus is felt to be normal size, shape, and contour, retroverted.  No adnexal masses or  tenderness noted.Bladder is non tender, no masses felt. Rectal: Deferred  Extremities/musculoskeletal:  No swelling or varicosities noted, no clubbing or cyanosis Psych:  No mood changes, alert and cooperative,seems happy AA is 2 Fall risk is high Depression screen PHQ 2/9 04/14/2021  Decreased Interest 1  Down, Depressed, Hopeless 1  PHQ - 2 Score 2  Altered sleeping 3  Tired, decreased energy 3  Change in appetite 1  Feeling bad or failure about yourself  2  Trouble concentrating 2  Moving slowly or fidgety/restless 1  Suicidal thoughts 0  PHQ-9 Score 14    GAD 7 : Generalized Anxiety Score 04/14/2021  Nervous, Anxious, on Edge 1  Control/stop worrying 0  Worry too much - different things 0  Trouble relaxing 1  Restless 1  Easily annoyed or irritable 1  Afraid - awful might happen 2  Total GAD 7 Score 6    She declines medication or referral   Upstream - 04/14/21 1344       Pregnancy Intention Screening   Does the patient want to become pregnant in the next year? No    Does the patient's partner want to become pregnant in the next year? No    Would the patient like to discuss contraceptive options today? No      Contraception Wrap Up   Current Method Female Condom    End Method Female Condom            Examination chaperoned by 06/14/21 LPN   Impression and Plan:  1. Urinary frequency Urine dipstick is negative   2. Encounter for gynecological examination with Papanicolaou smear of cervix Pap sent Physical in 1 year Pap in 3 if normal  3. Chronic pelvic pain in female Discussed seeing Dr Charlotta Newton for possible diagnostic laparoscopy and she declines   4. Anxiety and depression She says this is chronic and does not do well on meds, declines meds or referral

## 2021-04-18 LAB — CYTOLOGY - PAP
Chlamydia: NEGATIVE
Comment: NEGATIVE
Comment: NEGATIVE
Comment: NORMAL
Diagnosis: NEGATIVE
High risk HPV: NEGATIVE
Neisseria Gonorrhea: NEGATIVE

## 2021-08-11 ENCOUNTER — Other Ambulatory Visit: Payer: Self-pay | Admitting: Adult Health

## 2021-08-11 MED ORDER — NORETHINDRONE 0.35 MG PO TABS: 1.0000 | ORAL_TABLET | Freq: Every day | ORAL | 11 refills | Status: AC

## 2021-08-11 NOTE — Progress Notes (Signed)
Try micronor

## 2022-07-18 ENCOUNTER — Encounter (INDEPENDENT_AMBULATORY_CARE_PROVIDER_SITE_OTHER): Payer: Self-pay | Admitting: Gastroenterology

## 2023-06-07 NOTE — Progress Notes (Signed)
CARDIOLOGY CONSULT NOTE       Patient ID: Anita George MRN: [id number] DOB/AGE: Feb 07, 1981 42 y.o.  Admit date: (Not on file) Referring Physician: Helene Kelp Primary Physician: Selinda Flavin, MD Primary Cardiologist: New Reason for Consultation: Palpitations  Active Problems:   * No active hospital problems. *   HPI:  42 y.o. referred by Dr Caryn Bee  for palpitations. History of juvenile RA, lupus or some mixed connective tissue dx, fibromyalgia, GAD, with fibromyalgia, and depression. Distant history of palpitations with monitor showing NSVT 2010  Review of labs in Epic shows normal ESR 03/08/23 No recent thyroid or Hct.   Review of primary note indicates significant COPD Rx flovent and albuterol Started smoking age 32 2 ppd quit in 2007   Has had "throbbing " of her heart and atypical pains in chest No syncope Chronic dyspnea from COPD   Very deconditioned. Wears mask all the time and scarred of being sick. Complains of her abdominal aorta hurting her with prominent pulsations has lost some weight with mild postural symptoms   Last seen by Dr Andee Lineman in 2012 with benign monitor , echo and ETT  ROS All other systems reviewed and negative except as noted above  Past Medical History:  Diagnosis Date   Anxiety    Arnold-Chiari malformation (HCC)    Arthritis    AS (ankylosing spondylitis) (HCC)    Depression    Fatigue    Fibromyalgia    GERD (gastroesophageal reflux disease)    Headache    Hiatal hernia    Juvenile rheumatoid arthritis (HCC)    Lupus (systemic lupus erythematosus) (HCC)    mixed connective tissue disease per pt   Mixed connective tissue disease (HCC)    Palpitations    Pericardial effusion    Shortness of breath    Ventricular tachycardia (HCC)    nonsutained by cardiac monitor November 21, 2008 no other arrhythmias and negative workup for structural heart diseas e(normal echo)    Family History  Problem Relation Age of Onset   Depression Mother     Diabetes Mother    Heart disease Mother    Hiatal hernia Mother    Hypertension Mother    Fibromyalgia Mother    Depression Father    Diabetes Father    Heart disease Father    Hypertension Father    Depression Sister    Fibromyalgia Sister    Depression Brother    Drug abuse Brother    Alcohol abuse Brother    Diabetes Brother    Stroke Other    Hypertension Other    Hyperlipidemia Other    Stroke Maternal Grandmother     Social History   Socioeconomic History   Marital status: Married    Spouse name: [name]   Number of children: 0   Years of education: 16+   Highest education level: Not on file  Occupational History   Not on file  Tobacco Use   Smoking status: Former    Current packs/day: 0.00    Average packs/day: 2.0 packs/day for 11.0 years (22.0 ttl pk-yrs)    Types: Cigarettes    Start date: 04/21/1997    Quit date: 04/21/2008    Years since quitting: 15.1   Smokeless tobacco: Never   Tobacco comments:    quit  4 yrs ago after smoking 10 yr. 1 pack a day  Vaping Use   Vaping status: Never Used  Substance and Sexual Activity   Alcohol use: Yes  Comment: rarely   Drug use: No   Sexual activity: Yes    Birth control/protection: None, Condom  Other Topics Concern   Not on file  Social History Narrative   Lives at home with husband.   Caffeine use: Drinks coffee   Rarely drinks diet soda (1 can per day)   Social Determinants of Health   Financial Resource Strain: High Risk (04/14/2021)   Overall Financial Resource Strain (CARDIA)    Difficulty of Paying Living Expenses: Hard  Food Insecurity: Food Insecurity Present (04/14/2021)   Hunger Vital Sign    Worried About Running Out of Food in the Last Year: Sometimes true    Ran Out of Food in the Last Year: Never true  Transportation Needs: Unmet Transportation Needs (04/14/2021)   PRAPARE - Administrator, Civil Service (Medical): Yes    Lack of Transportation (Non-Medical): No  Physical  Activity: Inactive (04/14/2021)   Exercise Vital Sign    Days of Exercise per Week: 0 days    Minutes of Exercise per Session: 0 min  Stress: No Stress Concern Present (04/14/2021)   [employer] of Occupational Health - Occupational Stress Questionnaire    Feeling of Stress : Not at all  Social Connections: Unknown (01/17/2022)   Received from [facility], [facility]   Social Network    Social Network: Not on file  Intimate Partner Violence: Unknown (12/11/2021)   Received from [facility], [facility]   HITS    Physically Hurt: Not on file    Insult or Talk Down To: Not on file    Threaten Physical Harm: Not on file    Scream or Curse: Not on file    Past Surgical History:  Procedure Laterality Date   NO PAST SURGERIES     UPPER GASTROINTESTINAL ENDOSCOPY  2014   Dr.Benson @ [facility]      Current Outpatient Medications:    acetaZOLAMIDE (DIAMOX) 250 MG tablet, Take 250 mg by mouth 2 (two) times daily., Disp: , Rfl: 1   baclofen (LIORESAL) 10 MG tablet, Take 10 mg by mouth 3 (three) times daily., Disp: , Rfl:    Buprenorphine HCl (BELBUCA) 300 MCG FILM, Place inside cheek., Disp: , Rfl:    celecoxib (CELEBREX) 200 MG capsule, Take by mouth., Disp: , Rfl:    HYDROcodone-acetaminophen (NORCO/VICODIN) 5-325 MG tablet, take 1 -2 tablets a day as needed for severe pain.  30 day supply., Disp: , Rfl:    hydroxychloroquine (PLAQUENIL) 200 MG tablet, Take 200 mg by mouth daily., Disp: , Rfl: 0   linaclotide (LINZESS) 72 MCG capsule, Take by mouth., Disp: , Rfl:    metoprolol tartrate (LOPRESSOR) 25 MG tablet, Take 25 mg by mouth daily. , Disp: , Rfl:    pantoprazole (PROTONIX) 40 MG tablet, Take 1 tablet (40 mg total) by mouth daily. (Patient taking differently: Take 40 mg by mouth.), Disp: 90 tablet, Rfl: 0   predniSONE (DELTASONE) 10 MG tablet, Take by mouth., Disp: , Rfl:    QVAR REDIHALER 80 MCG/ACT inhaler, Inhale into the lungs., Disp: , Rfl:    topiramate (TOPAMAX) 100 MG  tablet, Take 2 tablets (200 mg total) by mouth at bedtime., Disp: 60 tablet, Rfl: 6   tretinoin (RETIN-A) 0.025 % gel, Apply topically., Disp: , Rfl:    BELBUCA 75 MCG FILM, PLACE 1 FILM TWICE DAILY AGAINST THE INSIDE OF THE CHEEK HOLDING IN PLACE FOR 5 SECONDS (Patient not taking: Reported on 06/11/2023), Disp: , Rfl:  2   levalbuterol (XOPENEX HFA) 45 MCG/ACT inhaler, SMARTSIG:1-2 Puff(s) Via Inhaler Every 4 Hours PRN, Disp: , Rfl:    montelukast (SINGULAIR) 10 MG tablet, SMARTSIG:1 Tablet(s) By Mouth Every Evening, Disp: , Rfl:    norethindrone (MICRONOR) 0.35 MG tablet, Take 1 tablet (0.35 mg total) by mouth daily. (Patient not taking: Reported on 06/11/2023), Disp: 28 tablet, Rfl: 11  Current Facility-Administered Medications:    hyoscyamine (LEVSIN SL) SL tablet 0.25 mg, 0.25 mg, Sublingual, BID PRN, Rehman, Joline Maxcy, MD    Physical Exam: Blood pressure 102/60, pulse 66, height 5\' 10"  (1.778 m), weight 145 lb 6.4 oz (66 kg), SpO2 98%.    Affect appropriate Chronically ill female  HEENT: normal Neck supple with no adenopathy JVP normal no bruits no thyromegaly Lungs clear with no wheezing and good diaphragmatic motion Heart:  S1/S2 no murmur, no rub, gallop or click PMI normal Abdomen: benighn, BS positve, no tenderness, no AAA no bruit.  No HSM or HJR Distal pulses intact with no bruits No edema Neuro non-focal Skin warm and dry No muscular weakness   Labs:   Lab Results  Component Value Date   WBC 5.9 07/28/2016   HGB 13.7 07/28/2016   HCT 41.4 07/28/2016   MCV 88.5 07/28/2016   PLT 155 07/28/2016   No results for input(s): "NA", "K", "CL", "CO2", "BUN", "CREATININE", "CALCIUM", "PROT", "BILITOT", "ALKPHOS", "ALT", "AST", "GLUCOSE" in the last 168 hours.  Invalid input(s): "LABALBU" No results found for: "CKTOTAL", "CKMB", "CKMBINDEX", "TROPONINI" No results found for: "CHOL" No results found for: "HDL" No results found for: "LDLCALC" No results found for:  "TRIG" No results found for: "CHOLHDL" No results found for: "LDLDIRECT"    Radiology: No results found.  EKG: SR rate 64 RAD otherwise normal    ASSESSMENT AND PLAN:   Palpitations benign sounding TSH/ Hct normal with primary Update TTE and 14 day monitor  CAD: risk given severe multiple connective dx diagnosis discussed screening with coronary calcium score RA: multiple connective tissue diagnosis. 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

## 2023-06-11 ENCOUNTER — Ambulatory Visit: Payer: 59 | Attending: Cardiovascular Disease

## 2023-06-11 ENCOUNTER — Encounter: Payer: Self-pay | Admitting: Cardiovascular Disease

## 2023-06-11 ENCOUNTER — Ambulatory Visit: Payer: 59 | Attending: Cardiovascular Disease | Admitting: Cardiovascular Disease

## 2023-06-11 ENCOUNTER — Other Ambulatory Visit (HOSPITAL_COMMUNITY)
Admission: RE | Admit: 2023-06-11 | Discharge: 2023-06-11 | Disposition: A | Discharge status: Home / Self Care | Payer: 59 | Source: Ambulatory Visit | Attending: Cardiovascular Disease | Admitting: Cardiovascular Disease

## 2023-06-11 VITALS — BP 102/60 | HR 66 | Ht 70.0 in | Wt 145.4 lb

## 2023-06-11 DIAGNOSIS — I779 Disorder of arteries and arterioles, unspecified: Secondary | ICD-10-CM | POA: Diagnosis not present

## 2023-06-11 DIAGNOSIS — R002 Palpitations: Secondary | ICD-10-CM | POA: Diagnosis not present

## 2023-06-11 DIAGNOSIS — Z79899 Other long term (current) drug therapy: Secondary | ICD-10-CM | POA: Diagnosis present

## 2023-06-11 DIAGNOSIS — G935 Compression of brain: Secondary | ICD-10-CM | POA: Diagnosis not present

## 2023-06-11 LAB — BASIC METABOLIC PANEL
Anion gap: 9 (ref 5–15)
BUN: 8 mg/dL (ref 6–20)
CO2: 24 mmol/L (ref 22–32)
Calcium: 9.2 mg/dL (ref 8.9–10.3)
Chloride: 103 mmol/L (ref 98–111)
Creatinine, Ser: 0.6 mg/dL (ref 0.44–1.00)
GFR, Estimated: >60 mL/min (ref >60)
Glucose, Bld: 96 mg/dL (ref 70–99)
Potassium: 4 mmol/L (ref 3.5–5.1)
Sodium: 136 mmol/L (ref 135–145)

## 2023-06-11 NOTE — Patient Instructions (Signed)
Medication Instructions:  Your physician recommends that you continue on your current medications as directed. Please refer to the Current Medication list given to you today.  *If you need a refill on your cardiac medications before your next appointment, please call your pharmacy*   Lab Work: Your physician recommends that you return for lab work in: Today    If you have labs (blood work) drawn today and your tests are completely normal, you will receive your results only by: MyChart Message (if you have MyChart) OR A paper copy in the mail If you have any lab test that is abnormal or we need to change your treatment, we will call you to review the results.   Testing/Procedures: Your physician has requested that you have an echocardiogram. Echocardiography is a painless test that uses sound waves to create images of your heart. It provides your doctor with information about the size and shape of your heart and how well your heart's chambers and valves are working. This procedure takes approximately one hour. There are no restrictions for this procedure. Please do NOT wear cologne, perfume, aftershave, or lotions (deodorant is allowed). Please arrive 15 minutes prior to your appointment time.  Calcium Score  14 day event monitor  Abdominal US     Follow-Up: At [facility], you and your health needs are our priority.  As part of our continuing mission to provide you with exceptional heart care, we have created designated Provider Care Teams.  These Care Teams include your primary Cardiologist (physician) and Advanced Practice Providers (APPs -  Physician Assistants and Nurse Practitioners) who all work together to provide you with the care you need, when you need it.  We recommend signing up for the patient portal called "MyChart".  Sign up information is provided on this After Visit Summary.  MyChart is used to connect with patients for Virtual Visits (Telemedicine).  Patients  are able to view lab/test results, encounter notes, upcoming appointments, etc.  Non-urgent messages can be sent to your provider as well.   To learn more about what you can do with MyChart, go to [email or web address].    Your next appointment:   After Testing   Provider:   You may see Charlton Haws, MD or one of the following Advanced Practice Providers on your designated Care Team:   Randall An, PA-C  Jacolyn Reedy, PA-C     Other Instructions Thank you for choosing [facility]!   ZIO XT- Long Term Monitor Instructions   Your physician has requested you wear your ZIO patch monitor___14____days.   This is a single patch monitor.  [employer] supplies one patch monitor per enrollment.  Additional stickers are not available.   Please do not apply patch if you will be having a Nuclear Stress Test, Echocardiogram, Cardiac CT, MRI, or Chest Xray during the time frame you would be wearing the monitor. The patch cannot be worn during these tests.  You cannot remove and re-apply the ZIO XT patch monitor.   Your ZIO patch monitor will be sent USPS Priority mail from [facility] directly to your home address. The monitor may also be mailed to a PO BOX if home delivery is not available.   It may take 3-5 days to receive your monitor after you have been enrolled.   Once you have received you monitor, please review enclosed instructions.  Your monitor has already been registered assigning a specific monitor serial # to you.   Applying the monitor  Shave hair from upper left chest.   Hold abrader disc by orange tab.  Rub abrader in 40 strokes over left upper chest as indicated in your monitor instructions.   Clean area with 4 enclosed alcohol pads .  Use all pads to assure are is cleaned thoroughly.  Let dry.   Apply patch as indicated in monitor instructions.  Patch will be place under collarbone on left side of chest with arrow pointing upward.   Rub patch adhesive  wings for 2 minutes.Remove white label marked "1".  Remove white label marked "2".  Rub patch adhesive wings for 2 additional minutes.   While looking in a mirror, press and release button in center of patch.  A small green light will flash 3-4 times .  This will be your only indicator the monitor has been turned on.     Do not shower for the first 24 hours.  You may shower after the first 24 hours.   Press button if you feel a symptom. You will hear a small click.  Record Date, Time and Symptom in the Patient Log Book.   When you are ready to remove patch, follow instructions on last 2 pages of Patient Log Book.  Stick patch monitor onto last page of Patient Log Book.   Place Patient Log Book in [place] box.  Use locking tab on box and tape box closed securely.  The [employer] and [employer] has [employer] on it.  Please place in mailbox as soon as possible.  Your physician should have your test results approximately 7 days after the monitor has been mailed back to [facility].   Call [facility] Customer Care at [phone] if you have questions regarding your ZIO XT patch monitor.  Call them immediately if you see an orange light blinking on your monitor.   If your monitor falls off in less than 4 days contact our Monitor department at [phone].  If your monitor becomes loose or falls off after 4 days call [employer] at [phone] for suggestions on securing your monitor.

## 2023-06-25 ENCOUNTER — Ambulatory Visit (HOSPITAL_COMMUNITY): Payer: 59

## 2023-06-25 ENCOUNTER — Other Ambulatory Visit (HOSPITAL_COMMUNITY): Payer: 59

## 2023-07-13 ENCOUNTER — Telehealth: Payer: Self-pay

## 2023-07-13 DIAGNOSIS — I471 Supraventricular tachycardia, unspecified: Secondary | ICD-10-CM

## 2023-07-13 NOTE — Telephone Encounter (Signed)
-----   Message from Charlton Haws sent at 07/12/2023  2:24 PM EST ----- Some SVT longest only about 20 seconds refer to EP ----- Message ----- From: Wendall Stade, MD Sent: 07/12/2023  12:20 PM EST To: Wendall Stade, MD

## 2023-07-13 NOTE — Telephone Encounter (Signed)
The patient has been notified of the result and verbalized understanding.  All questions (if any) were answered. Ethelda Chick, RN 07/13/2023 11:22 AM   Will place order for referral.

## 2023-07-23 ENCOUNTER — Ambulatory Visit (HOSPITAL_COMMUNITY)
Admission: RE | Admit: 2023-07-23 | Discharge: 2023-07-23 | Disposition: A | Discharge status: Home / Self Care | Payer: 59 | Source: Ambulatory Visit | Attending: Cardiovascular Disease | Admitting: Cardiovascular Disease

## 2023-07-23 DIAGNOSIS — R002 Palpitations: Secondary | ICD-10-CM | POA: Insufficient documentation

## 2023-07-23 DIAGNOSIS — I779 Disorder of arteries and arterioles, unspecified: Secondary | ICD-10-CM | POA: Insufficient documentation

## 2023-07-23 LAB — ECHOCARDIOGRAM COMPLETE
Area-P 1/2: 3.12 cm2
S' Lateral: 3.2 cm

## 2023-07-23 NOTE — Progress Notes (Signed)
*  PRELIMINARY RESULTS* Echocardiogram 2D Echocardiogram has been performed.  Stacey Drain 07/23/2023, 11:37 AM

## 2023-09-04 NOTE — Progress Notes (Signed)
 CARDIOLOGY CONSULT NOTE       Patient ID: Anita George MRN: [id number] DOB/AGE: 41-16-1982 41 y.o.  Admit date: (Not on file) Referring Physician: Prentice Graven Primary Physician: Kayla Drivers, MD Primary Cardiologist: Delford Reason for Consultation: Palpitations    HPI:  42 y.o. referred by Dr Graven  for palpitations. First seen by me 06/11/23 History of juvenile RA, lupus or some mixed connective tissue dx, fibromyalgia, GAD, with fibromyalgia, and depression. Distant history of palpitations with monitor showing NSVT 2010  Review of labs in Epic shows normal ESR 03/08/23 No recent thyroid or Hct.   Review of primary note indicates significant COPD Rx flovent and albuterol  Started smoking age 37 2 ppd quit in 2007   Has had throbbing  of her heart and atypical pains in chest No syncope Chronic dyspnea from COPD   Very deconditioned. Wears mask all the time and scarred of being sick. Complains of her abdominal aorta hurting her with prominent pulsations has lost some weight with mild postural symptoms   Last seen by Dr Jodine in 2012 with benign monitor , echo and ETT  Monitor from 07/12/23 reviewed self limited SVT longest 20 seconds   Patient had a min HR of 42 bpm, max HR of 169 bpm, and avg HR of 64 bpm. Predominant underlying rhythm was Sinus Rhythm. 59 Supraventricular Tachycardia runs occurred, the run with the fastest interval lasting 8 beats with a max rate of 169 bpm, the  longest lasting 20.3 secs with an avg rate of 91 bpm. Supraventricular Tachycardia was detected within +/- 45 seconds of symptomatic patient event(s). Isolated SVEs were rare (<1.0%), SVE Couplets were rare (<1.0%), and SVE Triplets were rare (<1.0%).  Isolated VEs were rare (<1.0%), VE Couplets were rare (<1.0%), and no VE Triplets were present.   11/15 Calcium score was 0 with no coronary plaque 11/15 TTE EF 55-60% Normal RV no significant valve dx  She continues to have multiple somatic  complaints that I don not thinks are serious. She describes some pleurisy and postural symptoms She continues to have palpitations. We discussed making her lopressor  dose bid so she has extra if needed She has f/u with [facility] this month but I doubt he would do anything differently  Her mom Dickey Hamilton who is also a patient of mine with CAD is with her today  ROS All other systems reviewed and negative except as noted above  Past Medical History:  Diagnosis Date   Anxiety    Arnold-Chiari malformation (HCC)    Arthritis    AS (ankylosing spondylitis) (HCC)    Depression    Fatigue    Fibromyalgia    GERD (gastroesophageal reflux disease)    Headache    Hiatal hernia    Juvenile rheumatoid arthritis (HCC)    Lupus (systemic lupus erythematosus) (HCC)    mixed connective tissue disease per pt   Mixed connective tissue disease (HCC)    Palpitations    Pericardial effusion    Shortness of breath    Vasculitis (HCC)    Ventricular tachycardia (HCC)    nonsutained by cardiac monitor November 21, 2008 no other arrhythmias and negative workup for structural heart diseas e(normal echo)    Family History  Problem Relation Age of Onset   Depression Mother    Diabetes Mother    Heart disease Mother    Hiatal hernia Mother    Hypertension Mother    Fibromyalgia Mother    Depression Father  Diabetes Father    Heart disease Father    Hypertension Father    Depression Sister    Fibromyalgia Sister    Depression Brother    Drug abuse Brother    Alcohol  abuse Brother    Diabetes Brother    Stroke Other    Hypertension Other    Hyperlipidemia Other    Stroke Maternal Grandmother     Social History   Socioeconomic History   Marital status: Married    Spouse name: [name]   Number of children: 0   Years of education: 16+   Highest education level: Not on file  Occupational History   Not on file  Tobacco Use   Smoking status: Former    Current packs/day: 0.00    Average  packs/day: 2.0 packs/day for 11.0 years (22.0 ttl pk-yrs)    Types: Cigarettes    Start date: 04/21/1997    Quit date: 04/21/2008    Years since quitting: 15.3   Smokeless tobacco: Never   Tobacco comments:    quit  4 yrs ago after smoking 10 yr. 1 pack a day  Vaping Use   Vaping status: Never Used  Substance and Sexual Activity   Alcohol  use: Yes    Comment: rarely   Drug use: No   Sexual activity: Yes    Birth control/protection: None, Condom  Other Topics Concern   Not on file  Social History Narrative   Lives at home with husband.   Caffeine use: Drinks coffee   Rarely drinks diet soda (1 can per day)   Social Drivers of Health   Financial Resource Strain: High Risk (04/14/2021)   Overall Financial Resource Strain (CARDIA)    Difficulty of Paying Living Expenses: Hard  Food Insecurity: Low Risk  (06/21/2023)   Received from [facility]   Hunger Vital Sign    Worried About Running Out of Food in the Last Year: Never true    Ran Out of Food in the Last Year: Never true  Transportation Needs: No Transportation Needs (06/21/2023)   Received from [facility]    In the past 12 months, has lack of reliable transportation kept you from medical appointments, meetings, work or from getting things needed for daily living? : No  Physical Activity: Inactive (04/14/2021)   Exercise Vital Sign    Days of Exercise per Week: 0 days    Minutes of Exercise per Session: 0 min  Stress: No Stress Concern Present (04/14/2021)   [employer] of Occupational Health - Occupational Stress Questionnaire    Feeling of Stress : Not at all  Social Connections: Unknown (01/17/2022)   Received from [facility], [facility]   Social Network    Social Network: Not on file  Intimate Partner Violence: Unknown (12/11/2021)   Received from [employer], [facility]   HITS    Physically Hurt: Not on file    Insult or Talk Down To: Not on file    Threaten Physical Harm: Not on  file    Scream or Curse: Not on file    Past Surgical History:  Procedure Laterality Date   NO PAST SURGERIES     UPPER GASTROINTESTINAL ENDOSCOPY  2014   Dr.Benson @ [facility]      Current Outpatient Medications:    acetaZOLAMIDE (DIAMOX) 250 MG tablet, Take 250 mg by mouth 2 (two) times daily., Disp: , Rfl: 1   baclofen (LIORESAL) 10 MG tablet, Take 10 mg by mouth 3 (  three) times daily., Disp: , Rfl:    BELBUCA 75 MCG FILM, , Disp: , Rfl: 2   Buprenorphine HCl (BELBUCA) 300 MCG FILM, Place inside cheek., Disp: , Rfl:    celecoxib (CELEBREX) 200 MG capsule, Take by mouth., Disp: , Rfl:    HYDROcodone-acetaminophen (NORCO/VICODIN) 5-325 MG tablet, take 1 -2 tablets a day as needed for severe pain.  30 day supply., Disp: , Rfl:    hydroxychloroquine (PLAQUENIL) 200 MG tablet, Take 200 mg by mouth daily., Disp: , Rfl: 0   levalbuterol (XOPENEX HFA) 45 MCG/ACT inhaler, SMARTSIG:1-2 Puff(s) Via Inhaler Every 4 Hours PRN, Disp: , Rfl:    linaclotide (LINZESS) 72 MCG capsule, Take by mouth., Disp: , Rfl:    metoprolol  tartrate (LOPRESSOR ) 25 MG tablet, Take 25 mg by mouth daily. , Disp: , Rfl:    montelukast  (SINGULAIR ) 10 MG tablet, SMARTSIG:1 Tablet(s) By Mouth Every Evening, Disp: , Rfl:    norethindrone  (MICRONOR ) 0.35 MG tablet, Take 1 tablet (0.35 mg total) by mouth daily., Disp: 28 tablet, Rfl: 11   pantoprazole  (PROTONIX ) 40 MG tablet, Take 1 tablet (40 mg total) by mouth daily. (Patient taking differently: Take 40 mg by mouth.), Disp: 90 tablet, Rfl: 0   predniSONE (DELTASONE) 10 MG tablet, Take by mouth., Disp: , Rfl:    QVAR REDIHALER 80 MCG/ACT inhaler, Inhale into the lungs., Disp: , Rfl:    topiramate  (TOPAMAX ) 100 MG tablet, Take 2 tablets (200 mg total) by mouth at bedtime., Disp: 60 tablet, Rfl: 6   tretinoin (RETIN-A) 0.025 % gel, Apply topically., Disp: , Rfl:   Current Facility-Administered Medications:    hyoscyamine  (LEVSIN  SL) SL tablet 0.25 mg, 0.25 mg, Sublingual, BID PRN,  Rehman, Claudis PENNER, MD    Physical Exam: Blood pressure (!) 110/58, pulse 76, height 5' 10 (1.778 m), weight 149 lb (67.6 kg), SpO2 98%.    Affect appropriate Chronically ill female  HEENT: normal Neck supple with no adenopathy JVP normal no bruits no thyromegaly Lungs clear with no wheezing and good diaphragmatic motion Heart:  S1/S2 no murmur, no rub, gallop or click PMI normal Abdomen: benighn, BS positve, no tenderness, no AAA no bruit.  No HSM or HJR Distal pulses intact with no bruits No edema Neuro non-focal Skin warm and dry No muscular weakness   Labs:   Lab Results  Component Value Date   WBC 5.9 07/28/2016   HGB 13.7 07/28/2016   HCT 41.4 07/28/2016   MCV 88.5 07/28/2016   PLT 155 07/28/2016   No results for input(s): NA, K, CL, CO2, BUN, CREATININE, CALCIUM, PROT, BILITOT, ALKPHOS, ALT, AST, GLUCOSE in the last 168 hours.  Invalid input(s): LABALBU No results found for: CKTOTAL, CKMB, CKMBINDEX, TROPONINI No results found for: CHOL No results found for: HDL No results found for: LDLCALC No results found for: TRIG No results found for: CHOLHDL No results found for: LDLDIRECT    Radiology: No results found.  EKG: SR rate 64 RAD otherwise normal    ASSESSMENT AND PLAN:   Palpitations TSH/ Hct normal with primary No structural heart dx by echo Monitor with self limited SVT. Rx beta blocker she has f/u with [facility] but doubt anything else to be done  CAD: risk given severe multiple connective dx diagnosis discussed Calcium score 0 repeat in 3 years  RA: multiple connective tissue diagnosis. ESR recently normal f/u with rheumatiology continue plaquenil Painful Aorta:  US  with no pathology 07/24/23 no AAA  Chiari Malformation:  f/u neuro  she has cut her diamox on her own as she thinks it makes her postural symptoms worse Encouraged her to f/u with neuro to see if she can stop it    F/U in a year     Signed: Maude Emmer 09/10/2023, 3:08 PM

## 2023-09-10 ENCOUNTER — Encounter: Payer: Self-pay | Admitting: Cardiovascular Disease

## 2023-09-10 ENCOUNTER — Ambulatory Visit: Payer: 59 | Attending: Cardiovascular Disease | Admitting: Cardiovascular Disease

## 2023-09-10 VITALS — BP 110/58 | HR 76 | Ht 70.0 in | Wt 149.0 lb

## 2023-09-10 DIAGNOSIS — R002 Palpitations: Secondary | ICD-10-CM

## 2023-09-10 DIAGNOSIS — I471 Supraventricular tachycardia, unspecified: Secondary | ICD-10-CM | POA: Diagnosis not present

## 2023-09-10 DIAGNOSIS — G935 Compression of brain: Secondary | ICD-10-CM

## 2023-09-10 MED ORDER — METOPROLOL TARTRATE 25 MG PO TABS: 25.0000 mg | ORAL_TABLET | Freq: Two times a day (BID) | ORAL | 3 refills | Status: AC

## 2023-09-10 NOTE — Patient Instructions (Signed)
 Medication Instructions:  Your physician recommends that you continue on your current medications as directed. Please refer to the Current Medication list given to you today.  Increase Lopressor  to Two Times a day   *If you need a refill on your cardiac medications before your next appointment, please call your pharmacy*   Lab Work: NONE   If you have labs (blood work) drawn today and your tests are completely normal, you will receive your results only by: MyChart Message (if you have MyChart) OR A paper copy in the mail If you have any lab test that is abnormal or we need to change your treatment, we will call you to review the results.   Testing/Procedures: NONE    Follow-Up: At [facility], you and your health needs are our priority.  As part of our continuing mission to provide you with exceptional heart care, we have created designated Provider Care Teams.  These Care Teams include your primary Cardiologist (physician) and Advanced Practice Providers (APPs -  Physician Assistants and Nurse Practitioners) who all work together to provide you with the care you need, when you need it.  We recommend signing up for the patient portal called MyChart.  Sign up information is provided on this After Visit Summary.  MyChart is used to connect with patients for Virtual Visits (Telemedicine).  Patients are able to view lab/test results, encounter notes, upcoming appointments, etc.  Non-urgent messages can be sent to your provider as well.   To learn more about what you can do with MyChart, go to [email or web address].    Your next appointment:   1 year(s)  Provider:   You may see Maude Emmer, MD or one of the following Advanced Practice Providers on your designated Care Team:   Laymon Qua, PA-C  Olivia Pavy, PA-C     Other Instructions Thank you for choosing [facility]!

## 2023-09-27 ENCOUNTER — Institutional Professional Consult (permissible substitution): Payer: 59 | Admitting: Internal Medicine

## 2023-10-06 ENCOUNTER — Encounter: Payer: Self-pay | Admitting: Internal Medicine

## 2023-10-06 ENCOUNTER — Ambulatory Visit: Payer: 59 | Attending: Internal Medicine | Admitting: Internal Medicine

## 2023-10-06 VITALS — BP 112/68 | HR 69 | Ht 70.0 in | Wt 151.8 lb

## 2023-10-06 DIAGNOSIS — R002 Palpitations: Secondary | ICD-10-CM

## 2023-10-06 NOTE — Progress Notes (Signed)
HPI Anita George is referred by Dr. Eden Emms for evaluation of palpitations. She is a pleasant 43 yo woman with a h/o severe COPD and JRA. She has an FEV1 of 27 and DLCO of 59%. She has had episodes of irregular heart beating for over a year. She notes palpitations as a child. She wore a cardiac monitor which demonstrated PAC's, PVC's and NS AT. No long runs. No prolonged pauses. She is not on any meds for heart racing. She has learned that caffeine avoidance helps her symptoms.  Allergies  Allergen Reactions   Cephalosporins     REACTION: rash     Current Outpatient Medications  Medication Sig Dispense Refill   acetaZOLAMIDE (DIAMOX) 250 MG tablet Take 250 mg by mouth 2 (two) times daily.  1   baclofen (LIORESAL) 10 MG tablet Take 10 mg by mouth 3 (three) times daily.     Buprenorphine HCl (BELBUCA) 300 MCG FILM Place inside cheek.     HYDROcodone-acetaminophen (NORCO/VICODIN) 5-325 MG tablet take 1 -2 tablets a day as needed for severe pain.  30 day supply.     hydroxychloroquine (PLAQUENIL) 200 MG tablet Take 200 mg by mouth daily.  0   levalbuterol (XOPENEX HFA) 45 MCG/ACT inhaler SMARTSIG:1-2 Puff(s) Via Inhaler Every 4 Hours PRN     linaclotide (LINZESS) 72 MCG capsule Take by mouth.     metoprolol tartrate (LOPRESSOR) 25 MG tablet Take 1 tablet (25 mg total) by mouth 2 (two) times daily. 180 tablet 3   montelukast (SINGULAIR) 10 MG tablet SMARTSIG:1 Tablet(s) By Mouth Every Evening     norethindrone (MICRONOR) 0.35 MG tablet Take 1 tablet (0.35 mg total) by mouth daily. 28 tablet 11   pantoprazole (PROTONIX) 40 MG tablet Take 1 tablet (40 mg total) by mouth daily. (Patient taking differently: Take 40 mg by mouth.) 90 tablet 0   QVAR REDIHALER 80 MCG/ACT inhaler Inhale into the lungs.     topiramate (TOPAMAX) 100 MG tablet Take 2 tablets (200 mg total) by mouth at bedtime. 60 tablet 6   tretinoin (RETIN-A) 0.025 % gel Apply topically.     Current Facility-Administered  Medications  Medication Dose Route Frequency Provider Last Rate Last Admin   hyoscyamine (LEVSIN SL) SL tablet 0.25 mg  0.25 mg Sublingual BID PRN Malissa Hippo, MD         Past Medical History:  Diagnosis Date   Anxiety    Arnold-Chiari malformation (HCC)    Arthritis    AS (ankylosing spondylitis) (HCC)    Depression    Fatigue    Fibromyalgia    GERD (gastroesophageal reflux disease)    Headache    Hiatal hernia    Juvenile rheumatoid arthritis (HCC)    Lupus (systemic lupus erythematosus) (HCC)    mixed connective tissue disease per pt   Mixed connective tissue disease (HCC)    Palpitations    Pericardial effusion    Shortness of breath    Vasculitis (HCC)    Ventricular tachycardia (HCC)    nonsutained by cardiac monitor November 21, 2008 no other arrhythmias and negative workup for structural heart diseas e(normal echo)    ROS:   All systems reviewed and negative except as noted in the HPI.   Past Surgical History:  Procedure Laterality Date   NO PAST SURGERIES     UPPER GASTROINTESTINAL ENDOSCOPY  2014   Dr.Benson @ [facility]     Family History  Problem Relation Age of Onset  Depression Mother    Diabetes Mother    Heart disease Mother    Hiatal hernia Mother    Hypertension Mother    Fibromyalgia Mother    Depression Father    Diabetes Father    Heart disease Father    Hypertension Father    Depression Sister    Fibromyalgia Sister    Depression Brother    Drug abuse Brother    Alcohol abuse Brother    Diabetes Brother    Stroke Other    Hypertension Other    Hyperlipidemia Other    Stroke Maternal Grandmother      Social History   Socioeconomic History   Marital status: Married    Spouse name: Anita George   Number of children: 0   Years of education: 16+   Highest education level: Not on file  Occupational History   Not on file  Tobacco Use   Smoking status: Former    Current packs/day: 0.00    Average packs/day: 2.0 packs/day for 11.0  years (22.0 ttl pk-yrs)    Types: Cigarettes    Start date: 04/21/1997    Quit date: 04/21/2008    Years since quitting: 15.4   Smokeless tobacco: Never   Tobacco comments:    quit  4 yrs ago after smoking 10 yr. 1 pack a day  Vaping Use   Vaping status: Never Used  Substance and Sexual Activity   Alcohol use: Yes    Comment: rarely   Drug use: No   Sexual activity: Yes    Birth control/protection: None, Condom  Other Topics Concern   Not on file  Social History Narrative   Lives at home with husband.   Caffeine use: Drinks coffee   Rarely drinks diet soda (1 can per day)   Social Drivers of Health   Financial Resource Strain: High Risk (04/14/2021)   Overall Financial Resource Strain (CARDIA)    Difficulty of Paying Living Expenses: Hard  Food Insecurity: Low Risk  (09/20/2023)   Received from [facility]   Hunger Vital Sign    Worried About Running Out of Food in the Last Year: Never true    Ran Out of Food in the Last Year: Never true  Transportation Needs: No Transportation Needs (09/20/2023)   Received from [facility]    In the past 12 months, has lack of reliable transportation kept you from medical appointments, meetings, work or from getting things needed for daily living? : No  Physical Activity: Inactive (04/14/2021)   Exercise Vital Sign    Days of Exercise per Week: 0 days    Minutes of Exercise per Session: 0 min  Stress: No Stress Concern Present (04/14/2021)   [employer] of Occupational Health - Occupational Stress Questionnaire    Feeling of Stress : Not at all  Social Connections: Unknown (01/17/2022)   Received from [facility], [facility]   Social Network    Social Network: Not on file  Intimate Partner Violence: Unknown (12/11/2021)   Received from [facility], [facility]   HITS    Physically Hurt: Not on file    Insult or Talk Down To: Not on file    Threaten Physical Harm: Not on file    Scream or Curse: Not on file      BP 112/68   Pulse 69   Ht 5\' 10"  (1.778 m)   Wt 151 lb 12.8 oz (68.9 kg)   SpO2 98%   BMI  21.78 kg/m   Physical Exam:  Well appearing NAD HEENT: Unremarkable Neck:  No JVD, no thyromegally Lymphatics:  No adenopathy Back:  No CVA tenderness Lungs:  Clear with no wheezes HEART:  Regular rate rhythm, no murmurs, no rubs, no clicks Abd:  soft, positive bowel sounds, no organomegally, no rebound, no guarding Ext:  2 plus pulses, no edema, no cyanosis, no clubbing Skin:  No rashes no nodules Neuro:  CN II through XII intact, motor grossly intact  DEVICE  Normal device function.  See PaceArt for details.   Assess/Plan: Palpitations - these are multifactorial but she has not had any symptoms of sustained heart racing. Her monitor is reassuring. I offered her the option of a trial of flecainide to reduce her symptoms. She would like to hold off on this med for now.   Anita Gowda Arlys Scatena,MD

## 2023-10-06 NOTE — Patient Instructions (Signed)
Medication Instructions:  Your physician recommends that you continue on your current medications as directed. Please refer to the Current Medication list given to you today.  *If you need a refill on your cardiac medications before your next appointment, please call your pharmacy*   Lab Work: NONE   If you have labs (blood work) drawn today and your tests are completely normal, you will receive your results only by: MyChart Message (if you have MyChart) OR A paper copy in the mail If you have any lab test that is abnormal or we need to change your treatment, we will call you to review the results.   Testing/Procedures: NONE    Follow-Up: At [facility], you and your health needs are our priority.  As part of our continuing mission to provide you with exceptional heart care, we have created designated Provider Care Teams.  These Care Teams include your primary Cardiologist (physician) and Advanced Practice Providers (APPs -  Physician Assistants and Nurse Practitioners) who all work together to provide you with the care you need, when you need it.  We recommend signing up for the patient portal called "MyChart".  Sign up information is provided on this After Visit Summary.  MyChart is used to connect with patients for Virtual Visits (Telemedicine).  Patients are able to view lab/test results, encounter notes, upcoming appointments, etc.  Non-urgent messages can be sent to your provider as well.   To learn more about what you can do with MyChart, go to [email or web address].    Your next appointment:    As Needed   Provider:   Lewayne Bunting, MD    Other Instructions Thank you for choosing [facility]!

## 2023-11-23 ENCOUNTER — Telehealth: Payer: Self-pay | Admitting: Cardiovascular Disease

## 2023-11-23 NOTE — Telephone Encounter (Signed)
 Pt requesting cb from practice mgr regarding info in pt's chart. Request cb after lunch if no cb today

## 2023-11-24 NOTE — Telephone Encounter (Signed)
 Spoke with patient on 11/23/2023 and we talked about notes in her chart that is requesting to be addended. I told her I would mail the document she needs to complete to get the process started. On 3/19/20205, HIM requested a call from [facility] to walk her through the process. I sent a MyChart message to [facility] asking her to give them a call.  HIM will handle this from here.

## 2024-01-26 ENCOUNTER — Telehealth: Payer: Self-pay | Admitting: Cardiovascular Disease

## 2024-01-26 NOTE — Telephone Encounter (Signed)
 Pt is requesting a callback wanting to know if she was tested for pulmonary hypertension as well. Please advise

## 2024-01-26 NOTE — Telephone Encounter (Signed)
 Pt notified that she did have an echo in November. Will route to Dr. Stann Earnest regarding results for pulmonary hypertension.

## 2024-01-27 NOTE — Telephone Encounter (Signed)
 The patient has been notified of the result and verbalized understanding.  All questions (if any) were answered. Casper Clement, CMA 01/27/2024 8:26 AM

## 2024-03-27 ENCOUNTER — Other Ambulatory Visit: Payer: Self-pay

## 2024-03-27 ENCOUNTER — Encounter: Payer: Self-pay | Admitting: Allergy

## 2024-03-27 ENCOUNTER — Ambulatory Visit: Admitting: Allergy

## 2024-03-27 VITALS — BP 108/78 | HR 60 | Temp 98.7°F | Resp 18 | Ht 70.67 in | Wt 154.0 lb

## 2024-03-27 DIAGNOSIS — J310 Chronic rhinitis: Secondary | ICD-10-CM | POA: Diagnosis not present

## 2024-03-27 DIAGNOSIS — J449 Chronic obstructive pulmonary disease, unspecified: Secondary | ICD-10-CM | POA: Diagnosis not present

## 2024-03-27 DIAGNOSIS — J329 Chronic sinusitis, unspecified: Secondary | ICD-10-CM

## 2024-03-27 MED ORDER — BUDESONIDE 0.25 MG/2ML IN SUSP: RESPIRATORY_TRACT | 12 refills | Status: AC

## 2024-03-27 MED ORDER — MONTELUKAST SODIUM 5 MG PO CHEW: 5.0000 mg | CHEWABLE_TABLET | Freq: Every day | ORAL | 5 refills | Status: AC

## 2024-03-27 NOTE — Progress Notes (Signed)
 New Patient Note  RE: Anita George MRN: [id number] DOB: March 02, 1981 Date of Office Visit: 03/27/2024  Primary care provider: Kayla Drivers, MD  Chief Complaint: recurrent sinus infections  History of present illness: Anita George is a 43 y.o. female presenting today for evaluation of recurrent sinus infections.   Discussed the use of AI scribe software for clinical note transcription with the patient, who gave verbal consent to proceed.  She experiences recurrent sinus infections approximately once every one to two months, with symptoms worsening in the winter and improving in the summer. These infections are associated with severe pain, ear pain, and migraines as well as congestion. Antibiotics have provided relief when used, although she has not frequently sought medical treatment for these infections due to other health concerns.  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  She however states her medical records were lost in a move and her mother who has autism was never able to recover those medical records.  She had a laspe I'm medical care for years.   As an adult she has been trying to get a formal diagnosis again of her symptoms.  She reports persistent symptoms such as knee swelling, pain, chronic fatigue, vasculitis and rashes. 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 Her condition has significantly impacted her life, leading to her quitting work in 2015 due to worsening symptoms.  She follows with a rheumatologist and is on Plaquenil.  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  She has had fluid draining of knee wen it has been swollen ever patient with consistent with inflammation.  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  She reports  they also told her possibly severe chronic asthma.  She is a former chronic smoker.  She experiences shortness of breath quite often and currently uses Arnuity Ellipta daily and Xopenex as needed but she does not know sting multiple times a week. She reports heart palpitations with most inhaler medications she has tried including albuterol  and even with Breztri, except Xopenex.  She states 1 puff of  Breztri and she felt like she was dying.  Her symptoms are potentially exacerbated by environmental factors, as she notes worsening symptoms in her home, which has a history of mold issues.  She reports a history of frequent bronchitis as a teenager.  A previous spirometry done by pulmonology (results provided by patient via a picture of the results) show  FEV1 21%, FVC 53%, ratio of 89% DLCO 44%  She experiences a constant runny nose, previously managed with Singulair , which she discontinued two months ago due to concerns about vasculitis.  She however would like to try taking a lower dose of Singulair .  She has tried various treatments for her nasal symptoms, including antihistamines and nasal sprays, with limited success. She performs saline rinses regularly, especially after exposure to dust or allergens.  She experiences persistent redness and white mucus in her throat, resembling strep throat, and has a history of a pus-filled lesion in her throat that resolved after self-drainage.   Review of systems: 10pt ROS negative unless noted above in HPI  Past medical history: Past Medical History:  Diagnosis Date   Anxiety    Arnold-Chiari malformation (HCC)    Arthritis    AS (ankylosing spondylitis) (HCC)    Asthma  Depression    Fatigue    Fibromyalgia    GERD (gastroesophageal reflux disease)    Headache    Hiatal hernia    Juvenile rheumatoid arthritis (HCC)    Lupus (systemic lupus erythematosus) (HCC)    mixed connective tissue disease per pt   Mixed connective tissue disease (HCC)     Palpitations    Pericardial effusion    Recurrent upper respiratory infection (URI)    Shortness of breath    Vasculitis (HCC)    Ventricular tachycardia (HCC)    nonsutained by cardiac monitor November 21, 2008 no other arrhythmias and negative workup for structural heart diseas e(normal echo)    Past surgical history: Past Surgical History:  Procedure Laterality Date   NO PAST SURGERIES     UPPER GASTROINTESTINAL ENDOSCOPY  2014   Dr.Benson @ [facility]    Family history:  Family History  Problem Relation Age of Onset   Depression Mother    Diabetes Mother    Heart disease Mother    Hiatal hernia Mother    Hypertension Mother    Fibromyalgia Mother    Depression Father    Diabetes Father    Heart disease Father    Hypertension Father    Depression Sister    Fibromyalgia Sister    Depression Brother    Drug abuse Brother    Alcohol  abuse Brother    Diabetes Brother    Stroke Other    Hypertension Other    Hyperlipidemia Other    Stroke Maternal Grandmother     Social history: Lives in a home [place] in [place] with gas heating and central cooling.  No pets in the home.  Dogs outside the home.  There is concern for mold in the home.  No concern for roaches in the home.  She previously worked in [employer] but now disabled. \ Tobacco Use   Smoking status: Former    Current packs/day: 0.00    Average packs/day: 2.0 packs/day for 11.0 years (22.0 ttl pk-yrs)    Types: Cigarettes    Start date: 04/21/1997    Quit date: 04/21/2008    Years since quitting: 15.9   Smokeless tobacco: Never   Tobacco comments:    quit  4 yrs ago after smoking 10 yr. 1 pack a day  Vaping Use   Vaping status: Never Used    Medication List: Current Outpatient Medications  Medication Sig Dispense Refill   acetaZOLAMIDE (DIAMOX) 250 MG tablet Take 250 mg by mouth 2 (two) times daily.  1   baclofen (LIORESAL) 10 MG tablet Take 10 mg by mouth 3 (three) times daily.     budesonide  (PULMICORT ) 0.25  MG/2ML nebulizer solution Add the entire 2ml vial of liquid Budesonide  (Pulmicort ) to the saline rinse bottle and mix together. 60 mL 12   Buprenorphine HCl (BELBUCA) 300 MCG FILM Place inside cheek.     HYDROcodone-acetaminophen (NORCO/VICODIN) 5-325 MG tablet take 1 -2 tablets a day as needed for severe pain.  30 day supply.     hydroxychloroquine (PLAQUENIL) 200 MG tablet Take 200 mg by mouth daily.  0   levalbuterol (XOPENEX HFA) 45 MCG/ACT inhaler SMARTSIG:1-2 Puff(s) Via Inhaler Every 4 Hours PRN     linaclotide (LINZESS) 72 MCG capsule Take by mouth.     metoprolol  tartrate (LOPRESSOR ) 25 MG tablet Take 1 tablet (25 mg total) by mouth 2 (two) times daily. 180 tablet 3   montelukast  (SINGULAIR ) 5 MG chewable tablet Chew 1 tablet (  5 mg total) by mouth at bedtime. 30 tablet 5   norethindrone  (MICRONOR ) 0.35 MG tablet Take 1 tablet (0.35 mg total) by mouth daily. 28 tablet 11   pantoprazole  (PROTONIX ) 40 MG tablet Take 1 tablet (40 mg total) by mouth daily. (Patient taking differently: Take 40 mg by mouth as needed.) 90 tablet 0   topiramate  (TOPAMAX ) 100 MG tablet Take 2 tablets (200 mg total) by mouth at bedtime. 60 tablet 6   tretinoin (RETIN-A) 0.025 % gel Apply topically.     ARNUITY ELLIPTA 100 MCG/ACT AEPB Inhale 1 puff into the lungs daily.     QVAR REDIHALER 80 MCG/ACT inhaler Inhale into the lungs. (Patient not taking: Reported on 03/27/2024)     Current Facility-Administered Medications  Medication Dose Route Frequency Provider Last Rate Last Admin   hyoscyamine  (LEVSIN  SL) SL tablet 0.25 mg  0.25 mg Sublingual BID PRN Rehman, Claudis PENNER, MD        Known medication allergies: No Active Allergies   Physical examination: Blood pressure 108/78, pulse 60, temperature 98.7 F (37.1 C), temperature source Temporal, resp. rate 18, height 5' 10.67 (1.795 m), weight 154 lb (69.9 kg), SpO2 98%.  General: Alert, interactive, in no acute distress. HEENT: PERRLA, TMs pearly gray with some  mild scarring bilaterally:, turbinates moderately edematous without discharge, post-pharynx mildly erythematous. Neck: Supple without lymphadenopathy. Lungs: Mildly decreased breath sounds bilaterally without wheezing, rhonchi or rales. {no increased work of breathing. CV: Normal S1, S2 without murmurs. Abdomen: Nondistended, nontender. Skin: Warm and dry, without lesions or rashes. Extremities:  No clubbing, cyanosis or edema. Neuro:   Grossly intact.  Diagnostics/Labs: Labs:  From 01/24/2024 Sedimentation Rate 0 - 30 mm/hr 13   C-Reactive Protein (CRP) <5.0 mg/L <5.0   LUPUS Inhibitor WITH Not Detected Not Detected   WBC 4.40 - 11.00 10*3/uL 4.8  RBC 4.10 - 5.10 10*6/uL 5.02  Hemoglobin 12.3 - 15.3 g/dL 85.0  Hematocrit 64.0 - 44.6 % 43.9  Mean Corpuscular Volume (MCV) 80.0 - 96.0 fL 87.5  Mean Corpuscular Hemoglobin (MCH) 27.5 - 33.2 pg 29.7  Mean Corpuscular Hemoglobin Conc (MCHC) 33.0 - 37.0 g/dL 34  Red Cell Distribution Width (RDW) 12.3 - 17.0 % 13.2  Platelet Count (PLT) 150 - 450 10*3/uL 118 Low   Mean Platelet Volume (MPV) 6.8 - 10.2 fL 11.4 High   Neutrophils % % 64  Lymphocytes % % 24  Monocytes % % 9  Eosinophils % % 1  Basophils % % 1  nRBC % % 0  Neutrophils Absolute 1.80 - 7.80 10*3/uL 3.1  Lymphocytes # 1.00 - 4.80 10*3/uL 1.2  Monocytes # 0.00 - 0.80 10*3/uL 0.4  Eosinophils # 0.00 - 0.50 10*3/uL 0.1  Basophils # 0.00 - 0.20 10*3/uL 0.1  nRBC Absolute <=0.00 10*3/uL 0   Sodium 136 - 145 mmol/L 138  Potassium 3.5 - 5.1 mmol/L 4.6  Comment: NO VISIBLE HEMOLYSIS  Chloride 98 - 107 mmol/L 104  CO2 21 - 31 mmol/L 26  Anion Gap 6 - 14 mmol/L 8  Glucose, Random 70 - 99 mg/dL 94  Blood Urea Nitrogen (BUN) 7 - 25 mg/dL 8  Creatinine 9.39 - 8.79 mg/dL 9.32  eGFR >40 fO/fpw/8.26f7 >90  Comment: GFR estimated by CKD-EPI equations(NKF 2021).  Recommend confirmation of Cr-based eGFR by using Cys-based eGFR and other filtration  markers (if applicable) in complex cases and clinical decision-making, as needed.  Albumin 3.5 - 5.7 g/dL 5.2  Total Protein 6.4 - 8.9 g/dL 7.6  Bilirubin, Total 0.3 - 1.0 mg/dL 1.9 High   Alkaline Phosphatase (ALP) 34 - 104 U/L 49  Aspartate Aminotransferase (AST) 13 - 39 U/L 19  Alanine Aminotransferase (ALT) 7 - 52 U/L 12  Calcium 8.6 - 10.3 mg/dL 9.9   C4 Complement 19 - 52 mg/dL 25   C3 Complement 87 - 200 mg/dL 887   Immunoglobulin A, Quantitative (IgA) 66 - 433 mg/dL 890  Immunoglobulin G, Quantitative (IgG) 635 - 1741 mg/dL 8,882  Immunoglobulin M, Quantitative (IgM) 45 - 281 mg/dL 57   Protein, Total, Urine <10 mg/dL 16 High   Creatinine, Urine >=20 mg/dL 744  Protein/Creatinine Ratio, Urine <=200 mg/g creat 63   Color, Urine Yellow Yellow  Clarity, Urine Clear Clear  Specific Gravity, Urine 1.005 - 1.025 1.03 High   pH, Urine 5.0 - 8.0 5.5  Protein, Urine Negative mg/dL 10 Abnormal   Glucose, Urine Negative mg/dL Negative  Ketones, Urine Negative mg/dL Negative  Bilirubin, Urine Negative Negative  Blood, Urine Negative Negative  Nitrite, Urine Negative Negative  Leukocyte Esterase, Urine Negative, 25 Negative  Urobilinogen, Urine <2.0 mg/dL Normal    Assessment and plan:   Asthma/COPD overlap Stage 4 COPD based on poor pulmonary function tests. CT shows pulmonary nodules. History of bronchitis and recurrent infections. Possible allergy component.  - Continue Arnuity Ellipta 1 puff daily as directed by pulmonology  - Xopenex 2 puffs every 4-6 hours as needed for cough/wheeze/shortness of breath/chest tightness.  May use 15-20 minutes prior to activity.   Monitor frequency of use.    Recurrent sinusitis Monthly sinus infections with potential link to allergies. Previous antibiotics effective but inconsistently used. - Perform environmental allergy panel and immune system work-up via labwork (vaccine titers, CH50 -- rest of immune labs  already done by rheumatology and looked good) - Initiate steroid nasal rinse with budesonide  1-2 times a day.   Add 1 budesonide  vial to saline flush - Singulair  5mg  daily for a month trial.  If this flares up your vasculitis then stop.   - Consider prophylactic antibiotics with azithromycin.  Discussed most likely she will have a low strep vaccine titer response and most do well will decrease infections after getting strep booster vaccine.  Autoimmune disease - Continue recommendations as directed by rheumatology  Follow-up in 3-4 months or sooner if needed  I appreciate the opportunity to take part in [facility] care. Please do not hesitate to contact me with questions.  Sincerely,   Danita Brain, MD Allergy/Immunology [facility]

## 2024-03-27 NOTE — Patient Instructions (Signed)
 Asthma/COPD overlap Stage 4 COPD based on poor pulmonary function tests. CT shows pulmonary nodules. History of bronchitis and recurrent infections. Possible allergy component.  - Continue Arnuity Ellipta 1 puff daily as directed by pulmonology  - Xopenex 2 puffs every 4-6 hours as needed for cough/wheeze/shortness of breath/chest tightness.  May use 15-20 minutes prior to activity.   Monitor frequency of use.    Recurrent sinusitis Monthly sinus infections with potential link to allergies. Previous antibiotics effective but inconsistently used. - Perform environmental allergy panel and immune system work-up via labwork (vaccine titers, CH50 -- rest of immune labs already done by rheumatology and looked good) - Initiate steroid nasal rinse with budesonide .   Add 1 budesonide  vial to saline flush (see below)  - Singulair  5mg  daily for a month trial.  If this flares up your vasculitis then stop.   - Consider prophylactic antibiotics if immunodeficiency confirmed.  Autoimmune disease - Continue recommendations as directed by your rheumatologist  Follow-up in 3-4 months or sooner if needed  Budesonide  (Pulmicort ) + Saline Irrigation/Rinse  Budesonide  (Pulmicort ) is an anti-inflammatory steroid medication used to decrease nasal and sinus inflammation. It is dispensed in liquid form in a vial. Although it is manufactured for use with a nebulizer, we intend for you to use it with the NeilMed Sinus Rinse bottle (preferred) or a Neti pot.   Instructions:  1) Make 240cc of saline in the NeilMed bottle using the salt packets or your own saline recipe (see separate handout).  2) Add the entire 2cc vial of liquid Budesonide  (Pulmicort ) to the rinse bottle and mix together.  3) While in the shower or over the sink, tilt your head forward to a comfortable level. Put the tip of the sinus rinse bottle in your nostril and aim it towards the crown or top of your head. Gently squeeze the bottle to flush out your  nose. The fluid will circulate in and out of your sinus cavities, coming back out from either nostril or through your mouth. Try not to swallow large quantities and spit it out instead.  4) Perform Budesonide  (Pulmicort ) + Saline irrigations 2 times daily.

## 2024-03-30 ENCOUNTER — Telehealth: Payer: Self-pay | Admitting: Allergy

## 2024-03-30 NOTE — Telephone Encounter (Signed)
 Pt requesting a call back, she states she thinks she has a sinus infection and needs advice

## 2024-03-31 ENCOUNTER — Ambulatory Visit: Payer: Self-pay | Admitting: Allergy

## 2024-03-31 LAB — ALLERGENS W/TOTAL IGE AREA 2
Alternaria Alternata IgE: <0.1 kU/L
Aspergillus Fumigatus IgE: <0.1 kU/L
Bermuda Grass IgE: <0.1 kU/L
Cat Dander IgE: <0.1 kU/L
Cedar, Mountain IgE: <0.1 kU/L
Cladosporium Herbarum IgE: <0.1 kU/L
Cockroach, German IgE: <0.1 kU/L
Common Silver Birch IgE: <0.1 kU/L
Cottonwood IgE: <0.1 kU/L
D Farinae IgE: <0.1 kU/L
D Pteronyssinus IgE: <0.1 kU/L
Dog Dander IgE: <0.1 kU/L
Elm, American IgE: <0.1 kU/L
IgE (Immunoglobulin E), Serum: <2 [IU]/mL — AB (ref 6–495)
Johnson Grass IgE: <0.1 kU/L
Maple/Box Elder IgE: <0.1 kU/L
Mouse Urine IgE: <0.1 kU/L
Oak, White IgE: <0.1 kU/L
Pecan, Hickory IgE: <0.1 kU/L
Penicillium Chrysogen IgE: <0.1 kU/L
Pigweed, Rough IgE: <0.1 kU/L
Ragweed, Short IgE: <0.1 kU/L
Sheep Sorrel IgE Qn: <0.1 kU/L
Timothy Grass IgE: <0.1 kU/L
White Mulberry IgE: <0.1 kU/L

## 2024-03-31 LAB — STREP PNEUMONIAE 23 SEROTYPES IGG
Pneumo Ab Type 1*: <0.1 ug/mL — AB (ref >1.3)
Pneumo Ab Type 12 (12F)*: <0.1 ug/mL — AB (ref >1.3)
Pneumo Ab Type 14*: 2 ug/mL (ref >1.3)
Pneumo Ab Type 17 (17F)*: 0.8 ug/mL — AB (ref >1.3)
Pneumo Ab Type 19 (19F)*: <0.1 ug/mL — AB (ref >1.3)
Pneumo Ab Type 2*: 2.1 ug/mL (ref >1.3)
Pneumo Ab Type 20*: 0.5 ug/mL — AB (ref >1.3)
Pneumo Ab Type 22 (22F)*: <0.1 ug/mL — AB (ref >1.3)
Pneumo Ab Type 23 (23F)*: 1.6 ug/mL (ref >1.3)
Pneumo Ab Type 26 (6B)*: <0.1 ug/mL — AB (ref >1.3)
Pneumo Ab Type 3*: <0.1 ug/mL — AB (ref >1.3)
Pneumo Ab Type 34 (10A)*: 0.2 ug/mL — AB (ref >1.3)
Pneumo Ab Type 4*: <0.1 ug/mL — AB (ref >1.3)
Pneumo Ab Type 43 (11A)*: 0.6 ug/mL — AB (ref >1.3)
Pneumo Ab Type 5*: <0.1 ug/mL — AB (ref >1.3)
Pneumo Ab Type 51 (7F)*: 0.4 ug/mL — AB (ref >1.3)
Pneumo Ab Type 54 (15B)*: 0.3 ug/mL — AB (ref >1.3)
Pneumo Ab Type 56 (18C)*: <0.1 ug/mL — AB (ref >1.3)
Pneumo Ab Type 57 (19A)*: 0.8 ug/mL — AB (ref >1.3)
Pneumo Ab Type 68 (9V)*: <0.1 ug/mL — AB (ref >1.3)
Pneumo Ab Type 70 (33F)*: 0.2 ug/mL — AB (ref >1.3)
Pneumo Ab Type 8*: 0.9 ug/mL — AB (ref >1.3)
Pneumo Ab Type 9 (9N)*: <0.1 ug/mL — AB (ref >1.3)

## 2024-03-31 LAB — DIPHTHERIA / TETANUS ANTIBODY PANEL
Diphtheria Ab: 0.11 [IU]/mL (ref <0.10)
Tetanus Ab, IgG: 1.31 [IU]/mL (ref <0.10)

## 2024-03-31 LAB — COMPLEMENT, TOTAL: Compl, Total (CH50): 57 U/mL (ref >41)

## 2024-03-31 NOTE — Telephone Encounter (Signed)
 I would recommend she schedule a follow-up appointment

## 2024-04-03 NOTE — Telephone Encounter (Signed)
 LVM requesting a callback for a follow up appointment.

## 2024-06-21 ENCOUNTER — Encounter (INDEPENDENT_AMBULATORY_CARE_PROVIDER_SITE_OTHER): Payer: Self-pay | Admitting: Gastroenterology

## 2024-07-03 ENCOUNTER — Ambulatory Visit: Admitting: Internal Medicine
